# Patient Record
Sex: Female | Born: 1965 | Race: White | Hispanic: No | Marital: Married | Smoking: Former smoker
Health system: Southern US, Community
[De-identification: ages and names within clinical notes are randomized; demographics above are authoritative.]

## PROBLEM LIST (undated history)

## (undated) DIAGNOSIS — R112 Nausea with vomiting, unspecified: Secondary | ICD-10-CM

## (undated) DIAGNOSIS — R51 Headache: Secondary | ICD-10-CM

## (undated) DIAGNOSIS — R519 Headache, unspecified: Secondary | ICD-10-CM

## (undated) DIAGNOSIS — Z9889 Other specified postprocedural states: Secondary | ICD-10-CM

## (undated) HISTORY — PX: DILATION AND CURETTAGE OF UTERUS: SHX78

## (undated) HISTORY — PX: TUBAL LIGATION: SHX77

## (undated) HISTORY — PX: NASAL SEPTUM SURGERY: SHX37

---

## 2005-07-19 ENCOUNTER — Other Ambulatory Visit: Admission: RE | Admit: 2005-07-19 | Discharge: 2005-07-19 | Payer: Self-pay | Admitting: Gynecology

## 2008-10-23 ENCOUNTER — Ambulatory Visit: Payer: Self-pay | Admitting: Women's Health

## 2008-10-23 ENCOUNTER — Encounter: Payer: Self-pay | Admitting: Women's Health

## 2008-10-23 ENCOUNTER — Other Ambulatory Visit: Admission: RE | Admit: 2008-10-23 | Discharge: 2008-10-23 | Payer: Self-pay | Admitting: Gynecology

## 2009-08-21 ENCOUNTER — Other Ambulatory Visit: Admission: RE | Admit: 2009-08-21 | Discharge: 2009-08-21 | Payer: Self-pay | Admitting: Gynecology

## 2009-08-21 ENCOUNTER — Ambulatory Visit: Payer: Self-pay | Admitting: Women's Health

## 2009-08-21 ENCOUNTER — Encounter: Payer: Self-pay | Admitting: Women's Health

## 2011-01-20 ENCOUNTER — Emergency Department (HOSPITAL_COMMUNITY)
Admission: EM | Admit: 2011-01-20 | Discharge: 2011-01-20 | Disposition: A | Discharge status: Home / Self Care | Payer: No Typology Code available for payment source | Attending: Emergency Medicine | Admitting: Emergency Medicine

## 2011-01-20 ENCOUNTER — Emergency Department (HOSPITAL_COMMUNITY): Payer: No Typology Code available for payment source

## 2011-01-20 DIAGNOSIS — M79609 Pain in unspecified limb: Secondary | ICD-10-CM | POA: Insufficient documentation

## 2011-01-20 DIAGNOSIS — S139XXA Sprain of joints and ligaments of unspecified parts of neck, initial encounter: Secondary | ICD-10-CM | POA: Insufficient documentation

## 2011-01-20 DIAGNOSIS — R0789 Other chest pain: Secondary | ICD-10-CM | POA: Insufficient documentation

## 2011-01-20 DIAGNOSIS — S8010XA Contusion of unspecified lower leg, initial encounter: Secondary | ICD-10-CM | POA: Insufficient documentation

## 2011-01-20 DIAGNOSIS — M542 Cervicalgia: Secondary | ICD-10-CM | POA: Insufficient documentation

## 2011-06-02 ENCOUNTER — Other Ambulatory Visit: Payer: Self-pay | Admitting: Women's Health

## 2011-06-02 ENCOUNTER — Other Ambulatory Visit (HOSPITAL_COMMUNITY)
Admission: RE | Admit: 2011-06-02 | Discharge: 2011-06-02 | Disposition: A | Discharge status: Home / Self Care | Payer: BC Managed Care – PPO | Source: Ambulatory Visit | Attending: Gynecology | Admitting: Gynecology

## 2011-06-02 ENCOUNTER — Encounter (INDEPENDENT_AMBULATORY_CARE_PROVIDER_SITE_OTHER): Payer: BC Managed Care – PPO | Admitting: Women's Health

## 2011-06-02 DIAGNOSIS — Z833 Family history of diabetes mellitus: Secondary | ICD-10-CM

## 2011-06-02 DIAGNOSIS — Z124 Encounter for screening for malignant neoplasm of cervix: Secondary | ICD-10-CM | POA: Insufficient documentation

## 2011-06-02 DIAGNOSIS — Z01419 Encounter for gynecological examination (general) (routine) without abnormal findings: Secondary | ICD-10-CM

## 2011-06-02 DIAGNOSIS — N912 Amenorrhea, unspecified: Secondary | ICD-10-CM

## 2011-06-02 DIAGNOSIS — R82998 Other abnormal findings in urine: Secondary | ICD-10-CM

## 2011-07-14 ENCOUNTER — Ambulatory Visit: Payer: BC Managed Care – PPO | Admitting: Women's Health

## 2011-07-14 ENCOUNTER — Other Ambulatory Visit: Payer: BC Managed Care – PPO

## 2011-07-21 ENCOUNTER — Encounter: Payer: Self-pay | Admitting: Women's Health

## 2011-07-21 ENCOUNTER — Ambulatory Visit (INDEPENDENT_AMBULATORY_CARE_PROVIDER_SITE_OTHER): Payer: BC Managed Care – PPO | Admitting: Women's Health

## 2011-07-21 ENCOUNTER — Other Ambulatory Visit: Payer: BC Managed Care – PPO

## 2011-07-21 ENCOUNTER — Other Ambulatory Visit: Payer: Self-pay

## 2011-07-21 VITALS — BP 120/70

## 2011-07-21 DIAGNOSIS — N912 Amenorrhea, unspecified: Secondary | ICD-10-CM

## 2011-07-21 DIAGNOSIS — R141 Gas pain: Secondary | ICD-10-CM

## 2011-07-21 DIAGNOSIS — R143 Flatulence: Secondary | ICD-10-CM

## 2011-07-21 DIAGNOSIS — M545 Low back pain, unspecified: Secondary | ICD-10-CM

## 2011-07-21 DIAGNOSIS — N946 Dysmenorrhea, unspecified: Secondary | ICD-10-CM

## 2011-07-21 DIAGNOSIS — R142 Eructation: Secondary | ICD-10-CM

## 2011-07-21 DIAGNOSIS — N926 Irregular menstruation, unspecified: Secondary | ICD-10-CM

## 2011-07-21 MED ORDER — IBUPROFEN 600 MG PO TABS: 600.0000 mg | ORAL_TABLET | Freq: Three times a day (TID) | ORAL | Status: AC | PRN

## 2011-07-21 NOTE — Progress Notes (Signed)
  Presents for an ultrasound. Was seen in the office for an annual in June of 2012. She has had a BTL, and has been amenorrheic for 3 months without menopausal symptoms. Her FSH in June was 9. We were going to withdrawal her with Provera, but she started her cycle the next day. She had a normal cycle in July for 4-5 days, states was slightly heavier with clots and cramping. Did review if her cycles space greater than 60 days to call the office. Did review menopause, she is having no symptoms at this time. Ultrasound today does show a uterus with some cortical cystic areas uterus does appear arcuate or subseptate she does have a history  4 D&Cs. The right endometrium is 7.52mm, right endometrium is 10mm, the right ovary does have follicles with the thin-walled cyst 19 x 15 did review probably functional. left ovary was normal. Will watch at this time, and call or return if cycle problems.

## 2011-07-30 ENCOUNTER — Other Ambulatory Visit: Payer: Self-pay

## 2011-07-30 MED ORDER — FLUCONAZOLE 150 MG PO TABS: 150.0000 mg | ORAL_TABLET | Freq: Once | ORAL | Status: AC

## 2011-07-30 NOTE — Telephone Encounter (Signed)
Sherri, please call in Diflucan 150 by mouth x1 dose. Instructed to come to office if no relief of symptoms.

## 2011-07-30 NOTE — Telephone Encounter (Signed)
C-O EXTERNAL VAGINAL ITCHING & WHITE DISCHARGE. LEAVING TO GO OUT OF TOWN AGAIN THIS WEEKEND & IS REQUESTING DIFLUCAN RX.

## 2011-08-30 ENCOUNTER — Encounter: Payer: Self-pay | Admitting: Women's Health

## 2012-02-04 ENCOUNTER — Other Ambulatory Visit: Payer: Self-pay | Admitting: Women's Health

## 2012-07-05 ENCOUNTER — Ambulatory Visit (INDEPENDENT_AMBULATORY_CARE_PROVIDER_SITE_OTHER): Payer: BC Managed Care – PPO | Admitting: Women's Health

## 2012-07-05 ENCOUNTER — Encounter: Payer: Self-pay | Admitting: Women's Health

## 2012-07-05 VITALS — BP 120/70 | Ht 65.5 in | Wt 172.0 lb

## 2012-07-05 DIAGNOSIS — N390 Urinary tract infection, site not specified: Secondary | ICD-10-CM

## 2012-07-05 DIAGNOSIS — Z01419 Encounter for gynecological examination (general) (routine) without abnormal findings: Secondary | ICD-10-CM

## 2012-07-05 DIAGNOSIS — Z87440 Personal history of urinary (tract) infections: Secondary | ICD-10-CM

## 2012-07-05 DIAGNOSIS — E079 Disorder of thyroid, unspecified: Secondary | ICD-10-CM

## 2012-07-05 LAB — TSH: TSH: 3.703 u[IU]/mL (ref 0.350–4.500)

## 2012-07-05 MED ORDER — NITROFURANTOIN MACROCRYSTAL 50 MG PO CAPS: ORAL_CAPSULE | ORAL | Status: DC

## 2012-07-05 NOTE — Progress Notes (Signed)
Janice Mendez 1966-09-13 [id number]    History:    The patient presents for annual exam.  3-4 day cycle every 3-8 weeks/BTL. History of normal Paps. Has not had a mammogram. History of recurrent UTIs, uses Macrodantin 50 mg with intercourse, with good relief.  Past medical history, past surgical history, family history and social history were all reviewed and documented in the EPIC chart. Daughter Brittney 39. Patient's Father was murdered at age 44. Works at [employer].  ROS:  A  ROS was performed and pertinent positives and negatives are included in the history.  Exam:  Filed Vitals:   07/05/12 1128  BP: 120/70    General appearance:  Normal Head/Neck:  Normal, without cervical or supraclavicular adenopathy. Thyroid:  Symmetrical, normal in size, without palpable masses or nodularity. Respiratory  Effort:  Normal  Auscultation:  Clear without wheezing or rhonchi Cardiovascular  Auscultation:  Regular rate, without rubs, murmurs or gallops  Edema/varicosities:  Not grossly evident Abdominal  Soft,nontender, without masses, guarding or rebound.  Liver/spleen:  No organomegaly noted  Hernia:  None appreciated  Skin  Inspection:  Grossly normal  Palpation:  Grossly normal Neurologic/psychiatric  Orientation:  Normal with appropriate conversation.  Mood/affect:  Normal  Genitourinary    Breasts: Examined lying and sitting.     Right: Without masses, retractions, discharge or axillary adenopathy.     Left: Without masses, retractions, discharge or axillary adenopathy.   Inguinal/mons:  Normal without inguinal adenopathy  External genitalia:  Normal  BUS/Urethra/Skene's glands:  Normal  Bladder:  Normal  Vagina:  Normal  Cervix:  Normal  Uterus:   normal in size, shape and contour.  Midline and mobile  Adnexa/parametria:     Rt: Without masses or tenderness.   Lt: Without masses or tenderness.  Anus and perineum: Normal  Digital rectal exam: Normal sphincter tone without  palpated masses or tenderness  Assessment/Plan:  46 y.o. MWF G4P1 for annual exam with no complaints other than frustration with weight.  Normal GYN exam History of recurrent UTIs with good relief with Macrodantin with intercourse  Plan: Has not had a mammogram, reviewed importance of screening mammograms instructed to schedule, breast center number was given to schedule. SBE's, increase exercise and decrease calories for weight loss, calcium rich diet, vitamin D 1000 daily encouraged. Instructed to call if cycles space greater than 60 days. Macrodantin 50 mg by mouth with intercourse given with instructions. Had labs drawn at work which she states are normal instructed to faxed to our office. TSH only today. No Pap, new screening recommendations reviewed.    Harrington Challenger WHNP, 1:05 PM 07/05/2012

## 2012-07-05 NOTE — Patient Instructions (Signed)

## 2013-07-06 ENCOUNTER — Other Ambulatory Visit: Payer: Self-pay | Admitting: Women's Health

## 2013-07-06 DIAGNOSIS — N390 Urinary tract infection, site not specified: Secondary | ICD-10-CM

## 2013-07-06 MED ORDER — NITROFURANTOIN MACROCRYSTAL 50 MG PO CAPS: ORAL_CAPSULE | ORAL | Status: DC

## 2013-10-09 ENCOUNTER — Ambulatory Visit (INDEPENDENT_AMBULATORY_CARE_PROVIDER_SITE_OTHER): Payer: BC Managed Care – PPO | Admitting: Women's Health

## 2013-10-09 ENCOUNTER — Other Ambulatory Visit (HOSPITAL_COMMUNITY)
Admission: RE | Admit: 2013-10-09 | Discharge: 2013-10-09 | Disposition: A | Discharge status: Home / Self Care | Payer: BC Managed Care – PPO | Source: Ambulatory Visit | Attending: Gynecology | Admitting: Gynecology

## 2013-10-09 ENCOUNTER — Encounter: Payer: Self-pay | Admitting: Women's Health

## 2013-10-09 VITALS — BP 118/80 | Ht 67.0 in | Wt 172.2 lb

## 2013-10-09 DIAGNOSIS — Z01419 Encounter for gynecological examination (general) (routine) without abnormal findings: Secondary | ICD-10-CM | POA: Insufficient documentation

## 2013-10-09 DIAGNOSIS — E079 Disorder of thyroid, unspecified: Secondary | ICD-10-CM

## 2013-10-09 DIAGNOSIS — N926 Irregular menstruation, unspecified: Secondary | ICD-10-CM

## 2013-10-09 DIAGNOSIS — N390 Urinary tract infection, site not specified: Secondary | ICD-10-CM

## 2013-10-09 DIAGNOSIS — Z833 Family history of diabetes mellitus: Secondary | ICD-10-CM

## 2013-10-09 LAB — TSH: TSH: 3.033 u[IU]/mL (ref 0.350–4.500)

## 2013-10-09 MED ORDER — NITROFURANTOIN MACROCRYSTAL 50 MG PO CAPS: ORAL_CAPSULE | ORAL | Status: DC

## 2013-10-09 NOTE — Progress Notes (Signed)
Janice Mendez [birth year] [id number]    History:    The patient presents for annual exam.  Last year cycles every 4-8 weeks. Normal cycle in June and 2 weeks ago. BTL.  Has had difficulty losing weight. Normal Pap history. Has not had a mammogram. Labs at work/ reports as normal. History of recurrent UTIs, Macrodantin 50 mg with intercourse and has had no UTIs in years.   Past medical history, past surgical history, family history and social history were all reviewed and documented in the EPIC chart.  Travels with work at [employer]. Mother hypertension, questionable cervical cancer but did not have surgery. Father unknown medical history, murdered  when young. Daughter Philippa [place] 27 doing well.   ROS:  A  ROS was performed and pertinent positives and negatives are included in the history.  Exam:  Filed Vitals:   10/09/13 1101  BP: 118/80    General appearance:  Normal Head/Neck:  Normal, without cervical or supraclavicular adenopathy. Thyroid:  Symmetrical, normal in size, without palpable masses or nodularity. Respiratory  Effort:  Normal  Auscultation:  Clear without wheezing or rhonchi Cardiovascular  Auscultation:  Regular rate, without rubs, murmurs or gallops  Edema/varicosities:  Not grossly evident Abdominal  Soft,nontender, without masses, guarding or rebound.  Liver/spleen:  No organomegaly noted  Hernia:  None appreciated  Skin  Inspection:  Grossly normal  Palpation:  Grossly normal Neurologic/psychiatric  Orientation:  Normal with appropriate conversation.  Mood/affect:  Normal  Genitourinary    Breasts: Examined lying and sitting.     Right: Without masses, retractions, discharge or axillary adenopathy.     Left: Without masses, retractions, discharge or axillary adenopathy.   Inguinal/mons:  Normal without inguinal adenopathy  External genitalia:  Normal  BUS/Urethra/Skene's glands:  Normal  Bladder:  Normal  Vagina:  Normal  Cervix:  Normal  Uterus:    normal in size, shape and contour.  Midline and mobile  Adnexa/parametria:     Rt: Without masses or tenderness.   Lt: Without masses or tenderness.  Anus and perineum: Normal  Digital rectal exam: Normal sphincter tone without palpated masses or tenderness  Assessment/Plan:  47 y.o.MWF G1P1  for annual exam.   Irregular cycles Recurrent UTIs resolved with Macrodantin 50 with intercourse  Plan: TSH, prolactin, and FSH. Pap, Pap normal 2012, new screening guidelines reviewed. Instructed to call if cycles space greater than 3 months. SBE's, reviewed importance of annual screening mammogram, instructed to schedule. Reviewed importance of regular exercise, calcium rich diet, vitamin D 1000 daily. Continue regular exercise. Macrodantin 50 mg with intercourse, prescription, proper use given and reviewed.   Harrington Challenger [facility], 12:51 PM 10/09/2013

## 2013-10-09 NOTE — Patient Instructions (Signed)

## 2013-10-10 ENCOUNTER — Other Ambulatory Visit: Payer: Self-pay

## 2013-10-10 DIAGNOSIS — Z1231 Encounter for screening mammogram for malignant neoplasm of breast: Secondary | ICD-10-CM

## 2013-10-10 LAB — URINALYSIS W MICROSCOPIC + REFLEX CULTURE
Bacteria, UA: NONE SEEN
Bilirubin Urine: NEGATIVE
Casts: NONE SEEN
Crystals: NONE SEEN
Glucose, UA: NEGATIVE mg/dL
Hgb urine dipstick: NEGATIVE
Ketones, ur: NEGATIVE mg/dL
Leukocytes, UA: NEGATIVE
Nitrite: NEGATIVE
Protein, ur: NEGATIVE mg/dL
Specific Gravity, Urine: 1.016 (ref 1.005–1.030)
Urobilinogen, UA: 1 mg/dL (ref 0.0–1.0)
pH: 7 (ref 5.0–8.0)

## 2013-10-10 LAB — PROLACTIN: Prolactin: 14.1 ng/mL

## 2013-11-13 ENCOUNTER — Ambulatory Visit
Admission: RE | Admit: 2013-11-13 | Discharge: 2013-11-13 | Disposition: A | Discharge status: Home / Self Care | Payer: BC Managed Care – PPO | Source: Ambulatory Visit

## 2013-11-13 DIAGNOSIS — Z1231 Encounter for screening mammogram for malignant neoplasm of breast: Secondary | ICD-10-CM

## 2014-10-07 ENCOUNTER — Other Ambulatory Visit: Payer: Self-pay | Admitting: Dermatology

## 2014-10-14 ENCOUNTER — Encounter: Payer: Self-pay | Admitting: Women's Health

## 2015-12-14 DIAGNOSIS — T884XXA Failed or difficult intubation, initial encounter: Secondary | ICD-10-CM

## 2015-12-14 HISTORY — DX: Failed or difficult intubation, initial encounter: T88.4XXA

## 2016-02-11 ENCOUNTER — Other Ambulatory Visit: Payer: Self-pay

## 2016-02-11 DIAGNOSIS — Z1231 Encounter for screening mammogram for malignant neoplasm of breast: Secondary | ICD-10-CM

## 2016-02-19 ENCOUNTER — Ambulatory Visit (INDEPENDENT_AMBULATORY_CARE_PROVIDER_SITE_OTHER): Payer: BLUE CROSS/BLUE SHIELD | Admitting: Women's Health

## 2016-02-19 ENCOUNTER — Encounter: Payer: Self-pay | Admitting: Women's Health

## 2016-02-19 VITALS — BP 130/90 | Ht 65.25 in | Wt 177.0 lb

## 2016-02-19 DIAGNOSIS — Z13220 Encounter for screening for lipoid disorders: Secondary | ICD-10-CM | POA: Diagnosis not present

## 2016-02-19 DIAGNOSIS — Z01419 Encounter for gynecological examination (general) (routine) without abnormal findings: Secondary | ICD-10-CM

## 2016-02-19 DIAGNOSIS — Z1329 Encounter for screening for other suspected endocrine disorder: Secondary | ICD-10-CM | POA: Diagnosis not present

## 2016-02-19 DIAGNOSIS — N390 Urinary tract infection, site not specified: Secondary | ICD-10-CM | POA: Diagnosis not present

## 2016-02-19 LAB — LIPID PANEL
Cholesterol: 194 mg/dL (ref 125–200)
HDL: 47 mg/dL (ref >=46)
LDL Cholesterol: 108 mg/dL (ref <130)
Total CHOL/HDL Ratio: 4.1 Ratio (ref <=5.0)
Triglycerides: 193 mg/dL — ABNORMAL HIGH (ref <150)
VLDL: 39 mg/dL — ABNORMAL HIGH (ref <30)

## 2016-02-19 LAB — CBC WITH DIFFERENTIAL/PLATELET
Basophils Absolute: 0 10*3/uL (ref 0.0–0.1)
Basophils Relative: 0 % (ref 0–1)
Eosinophils Absolute: 0.3 10*3/uL (ref 0.0–0.7)
Eosinophils Relative: 5 % (ref 0–5)
HCT: 42.2 % (ref 36.0–46.0)
Hemoglobin: 14.3 g/dL (ref 12.0–15.0)
Lymphocytes Relative: 26 % (ref 12–46)
Lymphs Abs: 1.3 10*3/uL (ref 0.7–4.0)
MCH: 33.9 pg (ref 26.0–34.0)
MCHC: 33.9 g/dL (ref 30.0–36.0)
MCV: 100 fL (ref 78.0–100.0)
MPV: 11.2 fL (ref 8.6–12.4)
Monocytes Absolute: 0.5 10*3/uL (ref 0.1–1.0)
Monocytes Relative: 9 % (ref 3–12)
Neutro Abs: 3.1 10*3/uL (ref 1.7–7.7)
Neutrophils Relative %: 60 % (ref 43–77)
Platelets: 220 10*3/uL (ref 150–400)
RBC: 4.22 MIL/uL (ref 3.87–5.11)
RDW: 12.9 % (ref 11.5–15.5)
WBC: 5.1 10*3/uL (ref 4.0–10.5)

## 2016-02-19 LAB — TSH: TSH: 2.96 mIU/L

## 2016-02-19 LAB — COMPREHENSIVE METABOLIC PANEL
ALT: 17 U/L (ref 6–29)
AST: 18 U/L (ref 10–35)
Albumin: 3.7 g/dL (ref 3.6–5.1)
Alkaline Phosphatase: 56 U/L (ref 33–115)
BUN: 13 mg/dL (ref 7–25)
CO2: 28 mmol/L (ref 20–31)
Calcium: 8.8 mg/dL (ref 8.6–10.2)
Chloride: 103 mmol/L (ref 98–110)
Creat: 0.83 mg/dL (ref 0.50–1.10)
Glucose, Bld: 83 mg/dL (ref 65–99)
Potassium: 4.2 mmol/L (ref 3.5–5.3)
Sodium: 138 mmol/L (ref 135–146)
Total Bilirubin: 0.4 mg/dL (ref 0.2–1.2)
Total Protein: 6.6 g/dL (ref 6.1–8.1)

## 2016-02-19 MED ORDER — NITROFURANTOIN MACROCRYSTAL 50 MG PO CAPS: ORAL_CAPSULE | ORAL | Status: DC

## 2016-02-19 NOTE — Patient Instructions (Signed)
Health Maintenance, Female Adopting a healthy lifestyle and getting preventive care can go a long way to promote health and wellness. Talk with your health care provider about what schedule of regular examinations is right for you. This is a good chance for you to check in with your provider about disease prevention and staying healthy. In between checkups, there are plenty of things you can do on your own. Experts have done a lot of research about which lifestyle changes and preventive measures are most likely to keep you healthy. Ask your health care provider for more information. WEIGHT AND DIET  Eat a healthy diet  Be sure to include plenty of vegetables, fruits, low-fat dairy products, and lean protein.  Do not eat a lot of foods high in solid fats, added sugars, or salt.  Get regular exercise. This is one of the most important things you can do for your health.  Most adults should exercise for at least 150 minutes each week. The exercise should increase your heart rate and make you sweat (moderate-intensity exercise).  Most adults should also do strengthening exercises at least twice a week. This is in addition to the moderate-intensity exercise.  Maintain a healthy weight  Body mass index (BMI) is a measurement that can be used to identify possible weight problems. It estimates body fat based on height and weight. Your health care provider can help determine your BMI and help you achieve or maintain a healthy weight.  For females 20 years of age and older:   A BMI below 18.5 is considered underweight.  A BMI of 18.5 to 24.9 is normal.  A BMI of 25 to 29.9 is considered overweight.  A BMI of 30 and above is considered obese.  Watch levels of cholesterol and blood lipids  You should start having your blood tested for lipids and cholesterol at 48 years of age, then have this test every 5 years.  You may need to have your cholesterol levels checked more often if:  Your lipid  or cholesterol levels are high.  You are older than 48 years of age.  You are at high risk for heart disease.  CANCER SCREENING   Lung Cancer  Lung cancer screening is recommended for adults 55-80 years old who are at high risk for lung cancer because of a history of smoking.  A yearly low-dose CT scan of the lungs is recommended for people who:  Currently smoke.  Have quit within the past 15 years.  Have at least a 30-pack-year history of smoking. A pack year is smoking an average of one pack of cigarettes a day for 1 year.  Yearly screening should continue until it has been 15 years since you quit.  Yearly screening should stop if you develop a health problem that would prevent you from having lung cancer treatment.  Breast Cancer  Practice breast self-awareness. This means understanding how your breasts normally appear and feel.  It also means doing regular breast self-exams. Let your health care provider know about any changes, no matter how small.  If you are in your 20s or 30s, you should have a clinical breast exam (CBE) by a health care provider every 1-3 years as part of a regular health exam.  If you are 40 or older, have a CBE every year. Also consider having a breast X-ray (mammogram) every year.  If you have a family history of breast cancer, talk to your health care provider about genetic screening.  If you   are at high risk for breast cancer, talk to your health care provider about having an MRI and a mammogram every year.  Breast cancer gene (BRCA) assessment is recommended for women who have family members with BRCA-related cancers. BRCA-related cancers include:  Breast.  Ovarian.  Tubal.  Peritoneal cancers.  Results of the assessment will determine the need for genetic counseling and BRCA1 and BRCA2 testing. Cervical Cancer Your health care provider may recommend that you be screened regularly for cancer of the pelvic organs (ovaries, uterus, and  vagina). This screening involves a pelvic examination, including checking for microscopic changes to the surface of your cervix (Pap test). You may be encouraged to have this screening done every 3 years, beginning at age 21.  For women ages 30-65, health care providers may recommend pelvic exams and Pap testing every 3 years, or they may recommend the Pap and pelvic exam, combined with testing for human papilloma virus (HPV), every 5 years. Some types of HPV increase your risk of cervical cancer. Testing for HPV may also be done on women of any age with unclear Pap test results.  Other health care providers may not recommend any screening for nonpregnant women who are considered low risk for pelvic cancer and who do not have symptoms. Ask your health care provider if a screening pelvic exam is right for you.  If you have had past treatment for cervical cancer or a condition that could lead to cancer, you need Pap tests and screening for cancer for at least 20 years after your treatment. If Pap tests have been discontinued, your risk factors (such as having a new sexual partner) need to be reassessed to determine if screening should resume. Some women have medical problems that increase the chance of getting cervical cancer. In these cases, your health care provider may recommend more frequent screening and Pap tests. Colorectal Cancer  This type of cancer can be detected and often prevented.  Routine colorectal cancer screening usually begins at 48 years of age and continues through 48 years of age.  Your health care provider may recommend screening at an earlier age if you have risk factors for colon cancer.  Your health care provider may also recommend using home test kits to check for hidden blood in the stool.  A small camera at the end of a tube can be used to examine your colon directly (sigmoidoscopy or colonoscopy). This is done to check for the earliest forms of colorectal  cancer.  Routine screening usually begins at age 50.  Direct examination of the colon should be repeated every 5-10 years through 48 years of age. However, you may need to be screened more often if early forms of precancerous polyps or small growths are found. Skin Cancer  Check your skin from head to toe regularly.  Tell your health care provider about any new moles or changes in moles, especially if there is a change in a mole's shape or color.  Also tell your health care provider if you have a mole that is larger than the size of a pencil eraser.  Always use sunscreen. Apply sunscreen liberally and repeatedly throughout the day.  Protect yourself by wearing long sleeves, pants, a wide-brimmed hat, and sunglasses whenever you are outside. HEART DISEASE, DIABETES, AND HIGH BLOOD PRESSURE   High blood pressure causes heart disease and increases the risk of stroke. High blood pressure is more likely to develop in:  People who have blood pressure in the high end   of the normal range (130-139/85-89 mm Hg).  People who are overweight or obese.  People who are African American.  If you are 41-94 years of age, have your blood pressure checked every 3-5 years. If you are 96 years of age or older, have your blood pressure checked every year. You should have your blood pressure measured twice--once when you are at a hospital or clinic, and once when you are not at a hospital or clinic. Record the average of the two measurements. To check your blood pressure when you are not at a hospital or clinic, you can use:  An automated blood pressure machine at a pharmacy.  A home blood pressure monitor.  If you are between 18 years and 86 years old, ask your health care provider if you should take aspirin to prevent strokes.  Have regular diabetes screenings. This involves taking a blood sample to check your fasting blood sugar level.  If you are at a normal weight and have a low risk for diabetes,  have this test once every three years after 48 years of age.  If you are overweight and have a high risk for diabetes, consider being tested at a younger age or more often. PREVENTING INFECTION  Hepatitis B  If you have a higher risk for hepatitis B, you should be screened for this virus. You are considered at high risk for hepatitis B if:  You were born in a country where hepatitis B is common. Ask your health care provider which countries are considered high risk.  Your parents were born in a high-risk country, and you have not been immunized against hepatitis B (hepatitis B vaccine).  You have HIV or AIDS.  You use needles to inject street drugs.  You live with someone who has hepatitis B.  You have had sex with someone who has hepatitis B.  You get hemodialysis treatment.  You take certain medicines for conditions, including cancer, organ transplantation, and autoimmune conditions. Hepatitis C  Blood testing is recommended for:  Everyone born from [birth year] through 1963.  Anyone with known risk factors for hepatitis C. Sexually transmitted infections (STIs)  You should be screened for sexually transmitted infections (STIs) including gonorrhea and chlamydia if:  You are sexually active and are younger than 48 years of age.  You are older than 48 years of age and your health care provider tells you that you are at risk for this type of infection.  Your sexual activity has changed since you were last screened and you are at an increased risk for chlamydia or gonorrhea. Ask your health care provider if you are at risk.  If you do not have HIV, but are at risk, it may be recommended that you take a prescription medicine daily to prevent HIV infection. This is called pre-exposure prophylaxis (PrEP). You are considered at risk if:  You are sexually active and do not regularly use condoms or know the HIV status of your partner(s).  You take drugs by injection.  You are sexually  active with a partner who has HIV. Talk with your health care provider about whether you are at high risk of being infected with HIV. If you choose to begin PrEP, you should first be tested for HIV. You should then be tested every 3 months for as long as you are taking PrEP.  PREGNANCY   If you are premenopausal and you may become pregnant, ask your health care provider about preconception counseling.  If you may  become pregnant, take 400 to 800 micrograms (mcg) of folic acid every day.  If you want to prevent pregnancy, talk to your health care provider about birth control (contraception). OSTEOPOROSIS AND MENOPAUSE   Osteoporosis is a disease in which the bones lose minerals and strength with aging. This can result in serious bone fractures. Your risk for osteoporosis can be identified using a bone density scan.  If you are 30 years of age or older, or if you are at risk for osteoporosis and fractures, ask your health care provider if you should be screened.  Ask your health care provider whether you should take a calcium or vitamin D supplement to lower your risk for osteoporosis.  Menopause may have certain physical symptoms and risks.  Hormone replacement therapy may reduce some of these symptoms and risks. Talk to your health care provider about whether hormone replacement therapy is right for you.  HOME CARE INSTRUCTIONS   Schedule regular health, dental, and eye exams.  Stay current with your immunizations.   Do not use any tobacco products including cigarettes, chewing tobacco, or electronic cigarettes.  If you are pregnant, do not drink alcohol.  If you are breastfeeding, limit how much and how often you drink alcohol.  Limit alcohol intake to no more than 1 drink per day for nonpregnant women. One drink equals 12 ounces of beer, 5 ounces of wine, or 1 ounces of hard liquor.  Do not use street drugs.  Do not share needles.  Ask your health care provider for help if  you need support or information about quitting drugs.  Tell your health care provider if you often feel depressed.  Tell your health care provider if you have ever been abused or do not feel safe at home.   This information is not intended to replace advice given to you by your health care provider. Make sure you discuss any questions you have with your health care provider.   Document Released: 06/14/2011 Document Revised: 12/20/2014 Document Reviewed: 10/31/2013 Elsevier Interactive Patient Education [employer].

## 2016-02-19 NOTE — Progress Notes (Signed)
Janice Mendez 1966/06/23 [id number]    History:    Presents for annual exam.  Cycles every 1-3 months/BTL. Reports extremely stressful year last year husband has a blood clotting disorder hospitalized twice in ICU. History of recurrent UTIs uses Macrodantin 50mg  with intercourse. No UTIs in years. Normal Pap and mammogram history.  Past medical history, past surgical history, family history and social history were all reviewed and documented in the EPIC chart. Works for [employer] traveling to [place] and [place] often. Mother hypertension. Father was murdered when she was Benzion Mesta minimal history. [place] 32 doing well.  ROS:  A ROS was performed and pertinent positives and negatives are included.  Exam:  Filed Vitals:   02/19/16 1117  BP: 130/90    General appearance:  Normal Thyroid:  Symmetrical, normal in size, without palpable masses or nodularity. Respiratory  Auscultation:  Clear without wheezing or rhonchi Cardiovascular  Auscultation:  Regular rate, without rubs, murmurs or gallops  Edema/varicosities:  Not grossly evident Abdominal  Soft,nontender, without masses, guarding or rebound.  Liver/spleen:  No organomegaly noted  Hernia:  None appreciated  Skin  Inspection:  Grossly normal   Breasts: Examined lying and sitting.     Right: Without masses, retractions, discharge or axillary adenopathy.     Left: Without masses, retractions, discharge or axillary adenopathy. Gentitourinary   Inguinal/mons:  Normal without inguinal adenopathy  External genitalia:  Normal  BUS/Urethra/Skene's glands:  Normal  Vagina:  Normal  Cervix:  Normal  Uterus:   normal in size, shape and contour.  Midline and mobile  Adnexa/parametria:     Rt: Without masses or tenderness.   Lt: Without masses or tenderness.  Anus and perineum: Normal  Digital rectal exam: Normal sphincter tone without palpated masses or tenderness  Assessment/Plan:  50 y.o. MWF G4 P1 for annual exam with no  complaints.  Cycles every 1-3 months/BTL/no menopausal symptoms History of recurrent UTIs with good relief Macrodantin with intercourse Borderline blood pressure  Plan: Check blood pressure away from office if continues greater than 130/80 follow-up with primary care. SBE's, annual screening mammogram has scheduled reviewed importance of annual screen. Macrodantin 50 mg when necessary with Cordis prescription, proper use given and reviewed. Exercise, calcium rich diet, vitamin D 2000 daily encouraged. Menopause removed and struck to to call if cycles space greater than 3 months.  CBC, CMP, lipid panel, vitamin D, UA, Pap with HR HPV typing, new screening guidelines reviewed.    Huel Cote WHNP, 12:09 PM 02/19/2016

## 2016-02-23 LAB — PAP IG AND HPV HIGH-RISK: HPV DNA High Risk: NOT DETECTED

## 2016-02-24 ENCOUNTER — Ambulatory Visit
Admission: RE | Admit: 2016-02-24 | Discharge: 2016-02-24 | Disposition: A | Discharge status: Home / Self Care | Payer: BLUE CROSS/BLUE SHIELD | Source: Ambulatory Visit

## 2016-02-24 DIAGNOSIS — Z1231 Encounter for screening mammogram for malignant neoplasm of breast: Secondary | ICD-10-CM

## 2016-04-02 ENCOUNTER — Encounter: Payer: Self-pay | Admitting: Family Medicine

## 2016-04-02 ENCOUNTER — Ambulatory Visit (INDEPENDENT_AMBULATORY_CARE_PROVIDER_SITE_OTHER): Payer: BLUE CROSS/BLUE SHIELD | Admitting: Family Medicine

## 2016-04-02 VITALS — BP 118/78 | HR 78 | Temp 98.9°F | Resp 16 | Ht 65.0 in | Wt 177.1 lb

## 2016-04-02 DIAGNOSIS — E049 Nontoxic goiter, unspecified: Secondary | ICD-10-CM | POA: Diagnosis not present

## 2016-04-02 DIAGNOSIS — J302 Other seasonal allergic rhinitis: Secondary | ICD-10-CM

## 2016-04-02 DIAGNOSIS — E010 Iodine-deficiency related diffuse (endemic) goiter: Secondary | ICD-10-CM | POA: Insufficient documentation

## 2016-04-02 DIAGNOSIS — J0100 Acute maxillary sinusitis, unspecified: Secondary | ICD-10-CM

## 2016-04-02 DIAGNOSIS — J320 Chronic maxillary sinusitis: Secondary | ICD-10-CM | POA: Insufficient documentation

## 2016-04-02 MED ORDER — SULFAMETHOXAZOLE-TRIMETHOPRIM 800-160 MG PO TABS: 1.0000 | ORAL_TABLET | Freq: Two times a day (BID) | ORAL | Status: DC

## 2016-04-02 NOTE — Progress Notes (Signed)
   Subjective:    Patient ID: Janice Mendez, female    DOB: Sep 03, 1966, 50 y.o.   MRN: [id number]  HPI New to establish.  Previous MD- no PCP.  [place], [place]  URI- pt reports starting late Feb she developed palpitations and fatigue and was told that she had the flu, 'but it didn't feel like the flu'.  A few weeks ago developed severe congestion, tooth pain, sinus pressure, HA and was started on 5 days of Doxy by [facility].  She completed abx on Sunday but is not feeling better.  Continues to have HA, nasal congestion, PND, sinus pressure.  Now having increased GERD, has feeling that tongue is burning.  R ear pressure.  No fevers but + chills.  Taking Zyrtec daily 'for years' and using Nasacort regularly.   Review of Systems For ROS see HPI     Objective:   Physical Exam  Constitutional: She is oriented to person, place, and time. She appears well-developed and well-nourished. No distress.  HENT:  Head: Normocephalic and atraumatic.  Right Ear: Tympanic membrane normal.  Left Ear: Tympanic membrane normal.  Nose: Mucosal edema and rhinorrhea present. Right sinus exhibits maxillary sinus tenderness and frontal sinus tenderness. Left sinus exhibits maxillary sinus tenderness and frontal sinus tenderness.  Mouth/Throat: Uvula is midline and mucous membranes are normal. Posterior oropharyngeal erythema present. No oropharyngeal exudate.  Eyes: Conjunctivae and EOM are normal. Pupils are equal, round, and reactive to light.  Neck: Normal range of motion. Neck supple. Thyromegaly present.  Cardiovascular: Normal rate, regular rhythm and normal heart sounds.   Pulmonary/Chest: Effort normal and breath sounds normal. No respiratory distress. She has no wheezes.  Lymphadenopathy:    She has no cervical adenopathy.  Neurological: She is alert and oriented to person, place, and time.  Skin: Skin is warm and dry.  Psychiatric: She has a normal mood and affect. Her behavior is  normal. Thought content normal.  Vitals reviewed.         Assessment & Plan:

## 2016-04-02 NOTE — Progress Notes (Signed)
Pre visit review using our clinic review tool, if applicable. No additional management support is needed unless otherwise documented below in the visit note. 

## 2016-04-02 NOTE — Assessment & Plan Note (Signed)
New to provider, ongoing for pt.  She reports she was previously on thyroid medication.  Recent TSH at GYN was WNL.  Get [place] to assess.  Pt expressed understanding and is in agreement w/ plan.

## 2016-04-02 NOTE — Assessment & Plan Note (Signed)
New to provider, ongoing for pt x1 month.  Only had a 5 day course of Doxy but had no improvement.  Will switch to 14 day course of Bactrim as pt has hx of sinus infxns and says 'this is the worst I've had'.  Switch daily antihistamine to Claritin as pt has been on Zyrtec for years and this is likely no longer effective in her system.  Continue nasal steroid daily.  Reviewed supportive care and red flags that should prompt return.  Pt expressed understanding and is in agreement w/ plan.

## 2016-04-02 NOTE — Patient Instructions (Signed)
Schedule your complete physical in 6 months We'll notify you of your ultrasound appt for the thyroid Start the Bactrim twice daily- take w/ food Switch the Zyrtec to the Loratadine for the next month or so Continue the Nasacort daily Drink plenty of fluids REST! Tylenol/Ibuprofen for pain/fever Call with any questions or concerns Welcome!  We're glad to have you!!!

## 2016-04-02 NOTE — Assessment & Plan Note (Signed)
New to provider, ongoing for pt.  Will switch her current Zyrtec to Claritin as she is no longer having relief.  Continue nasal steroid daily.  Pt expressed understanding and is in agreement w/ plan.

## 2016-04-07 ENCOUNTER — Ambulatory Visit (HOSPITAL_COMMUNITY)
Admission: RE | Admit: 2016-04-07 | Discharge: 2016-04-07 | Disposition: A | Discharge status: Home / Self Care | Payer: BLUE CROSS/BLUE SHIELD | Source: Ambulatory Visit | Attending: Family Medicine | Admitting: Family Medicine

## 2016-04-07 ENCOUNTER — Other Ambulatory Visit: Payer: Self-pay | Admitting: Family Medicine

## 2016-04-07 ENCOUNTER — Telehealth: Payer: Self-pay | Admitting: Family Medicine

## 2016-04-07 DIAGNOSIS — E049 Nontoxic goiter, unspecified: Secondary | ICD-10-CM | POA: Insufficient documentation

## 2016-04-07 DIAGNOSIS — E010 Iodine-deficiency related diffuse (endemic) goiter: Secondary | ICD-10-CM | POA: Diagnosis not present

## 2016-04-07 DIAGNOSIS — E042 Nontoxic multinodular goiter: Secondary | ICD-10-CM | POA: Diagnosis not present

## 2016-04-13 ENCOUNTER — Other Ambulatory Visit: Payer: Self-pay | Admitting: Otolaryngology

## 2016-04-13 DIAGNOSIS — E042 Nontoxic multinodular goiter: Secondary | ICD-10-CM

## 2016-04-21 ENCOUNTER — Other Ambulatory Visit (HOSPITAL_COMMUNITY)
Admission: RE | Admit: 2016-04-21 | Discharge: 2016-04-21 | Disposition: A | Discharge status: Home / Self Care | Payer: BLUE CROSS/BLUE SHIELD | Source: Ambulatory Visit | Attending: Interventional Radiology | Admitting: Interventional Radiology

## 2016-04-21 ENCOUNTER — Ambulatory Visit
Admission: RE | Admit: 2016-04-21 | Discharge: 2016-04-21 | Disposition: A | Discharge status: Home / Self Care | Payer: BLUE CROSS/BLUE SHIELD | Source: Ambulatory Visit | Attending: Otolaryngology | Admitting: Otolaryngology

## 2016-04-21 DIAGNOSIS — E042 Nontoxic multinodular goiter: Secondary | ICD-10-CM | POA: Diagnosis not present

## 2016-04-30 ENCOUNTER — Encounter (HOSPITAL_COMMUNITY): Payer: Self-pay

## 2016-05-03 ENCOUNTER — Telehealth: Payer: Self-pay | Admitting: Family Medicine

## 2016-05-03 DIAGNOSIS — E049 Nontoxic goiter, unspecified: Secondary | ICD-10-CM

## 2016-05-03 NOTE — Telephone Encounter (Signed)
[place] for referral to Dr Chalmers Cater but Dr Chalmers Cater is an Endo, not a Psychologist, sport and exercise.  As long as she is aware of this

## 2016-05-03 NOTE — Telephone Encounter (Signed)
[place] for referral you seen pt on 4/17 and pt was diagnosed for thyromegaly

## 2016-05-03 NOTE — Telephone Encounter (Signed)
Pt states that she would like to have a referral to dr balan endocrinologist, pt states that she would like to have a 2nd option on her thyroid.

## 2016-05-03 NOTE — Telephone Encounter (Signed)
the patient informed and referral placed.

## 2016-05-18 ENCOUNTER — Ambulatory Visit: Payer: Self-pay | Admitting: Surgery

## 2016-05-19 NOTE — Patient Instructions (Addendum)
Janice Mendez  05/19/2016   Your procedure is scheduled on: Thursday 06/03/2016  Report to [facility] Main  Entrance take [place]  elevators to 3rd floor to  [place] at  0730 AM.  Call this number if you have problems the morning of surgery [phone]   Remember: ONLY 1 PERSON MAY GO WITH YOU TO SHORT STAY TO GET  READY MORNING OF [place].   Do not eat food or drink liquids :After Midnight.     Take these medicines the morning of surgery with A SIP OF WATER: Loratadine, Nasacort nasal spray if needed                                 You may not have any metal on your body including hair pins and              piercings  Do not wear jewelry, make-up, lotions, powders or perfumes, deodorant             Do not wear nail polish.  Do not shave  48 hours prior to surgery.           Do not bring valuables to the hospital. [place].  Contacts, dentures or bridgework may not be worn into surgery.  Leave suitcase in the car. After surgery it may be brought to your room.                  Please read over the following fact sheets you were given: _____________________________________________________________________             [facility] - Preparing for Surgery Before surgery, you can play an important role.  Because skin is not sterile, your skin needs to be as free of germs as possible.  You can reduce the number of germs on your skin by washing with CHG (chlorahexidine gluconate) soap before surgery.  CHG is an antiseptic cleaner which kills germs and bonds with the skin to continue killing germs even after washing. Please DO NOT use if you have an allergy to CHG or antibacterial soaps.  If your skin becomes reddened/irritated stop using the CHG and inform your nurse when you arrive at Short Stay. Do not shave (including legs and underarms) for at least 48 hours prior to the first CHG shower.  You  may shave your face/neck. Please follow these instructions carefully:  1.  Shower with CHG Soap the night before surgery and the  morning of Surgery.  2.  If you choose to wash your hair, wash your hair first as usual with your  normal  shampoo.  3.  After you shampoo, rinse your hair and body thoroughly to remove the  shampoo.                           4.  Use CHG as you would any other liquid soap.  You can apply chg directly  to the skin and wash                       Gently with a scrungie or clean washcloth.  5.  Apply the CHG Soap to your  body ONLY FROM THE NECK DOWN.   Do not use on face/ open                           Wound or open sores. Avoid contact with eyes, ears mouth and genitals (private parts).                       Wash face,  Genitals (private parts) with your normal soap.             6.  Wash thoroughly, paying special attention to the area where your surgery  will be performed.  7.  Thoroughly rinse your body with warm water from the neck down.  8.  DO NOT shower/wash with your normal soap after using and rinsing off  the CHG Soap.                9.  Pat yourself dry with a clean towel.            10.  Wear clean pajamas.            11.  Place clean sheets on your bed the night of your first shower and do not  sleep with pets. Day of Surgery : Do not apply any lotions/deodorants the morning of surgery.  Please wear clean clothes to the hospital/surgery center.  FAILURE TO FOLLOW THESE INSTRUCTIONS MAY RESULT IN THE CANCELLATION OF YOUR SURGERY PATIENT SIGNATURE_________________________________  NURSE SIGNATURE__________________________________  ________________________________________________________________________   Adam Phenix  An incentive spirometer is a tool that can help keep your lungs clear and active. This tool measures how well you are filling your lungs with each breath. Taking long deep breaths may help reverse or decrease the chance of  developing breathing (pulmonary) problems (especially infection) following:  A long period of time when you are unable to move or be active. BEFORE THE PROCEDURE   If the spirometer includes an indicator to show your best effort, your nurse or respiratory therapist will set it to a desired goal.  If possible, sit up straight or lean slightly forward. Try not to slouch.  Hold the incentive spirometer in an upright position. INSTRUCTIONS FOR USE  1. Sit on the edge of your bed if possible, or sit up as far as you can in bed or on a chair. 2. Hold the incentive spirometer in an upright position. 3. Breathe out normally. 4. Place the mouthpiece in your mouth and seal your lips tightly around it. 5. Breathe in slowly and as deeply as possible, raising the piston or the ball toward the top of the column. 6. Hold your breath for 3-5 seconds or for as long as possible. Allow the piston or ball to fall to the bottom of the column. 7. Remove the mouthpiece from your mouth and breathe out normally. 8. Rest for a few seconds and repeat Steps 1 through 7 at least 10 times every 1-2 hours when you are awake. Take your time and take a few normal breaths between deep breaths. 9. The spirometer may include an indicator to show your best effort. Use the indicator as a goal to work toward during each repetition. 10. After each set of 10 deep breaths, practice coughing to be sure your lungs are clear. If you have an incision (the cut made at the time of surgery), support your incision when coughing by placing a pillow or rolled up towels firmly against it.  Once you are able to get out of bed, walk around indoors and cough well. You may stop using the incentive spirometer when instructed by your caregiver.  RISKS AND COMPLICATIONS  Take your time so you do not get dizzy or light-headed.  If you are in pain, you may need to take or ask for pain medication before doing incentive spirometry. It is harder to take a  deep breath if you are having pain. AFTER USE  Rest and breathe slowly and easily.  It can be helpful to keep track of a log of your progress. Your caregiver can provide you with a simple table to help with this. If you are using the spirometer at home, follow these instructions: [place] IF:   You are having difficultly using the spirometer.  You have trouble using the spirometer as often as instructed.  Your pain medication is not giving enough relief while using the spirometer.  You develop fever of 100.5 F (38.1 C) or higher. SEEK IMMEDIATE MEDICAL CARE IF:   You cough up bloody sputum that had not been present before.  You develop fever of 102 F (38.9 C) or greater.  You develop worsening pain at or near the incision site. MAKE SURE YOU:   Understand these instructions.  Will watch your condition.  Will get help right away if you are not doing well or get worse. Document Released: 04/11/2007 Document Revised: 02/21/2012 Document Reviewed: 06/12/2007 [facility] Patient Information 2014 [place], [place].   ________________________________________________________________________

## 2016-05-20 ENCOUNTER — Encounter (HOSPITAL_COMMUNITY)
Admission: RE | Admit: 2016-05-20 | Discharge: 2016-05-20 | Disposition: A | Discharge status: Home / Self Care | Payer: BLUE CROSS/BLUE SHIELD | Source: Ambulatory Visit | Attending: Surgery | Admitting: Surgery

## 2016-05-20 ENCOUNTER — Encounter (INDEPENDENT_AMBULATORY_CARE_PROVIDER_SITE_OTHER): Payer: Self-pay

## 2016-05-20 ENCOUNTER — Encounter (HOSPITAL_COMMUNITY): Payer: Self-pay

## 2016-05-20 ENCOUNTER — Ambulatory Visit (HOSPITAL_COMMUNITY)
Admission: RE | Admit: 2016-05-20 | Discharge: 2016-05-20 | Disposition: A | Discharge status: Home / Self Care | Payer: BLUE CROSS/BLUE SHIELD | Source: Ambulatory Visit | Attending: Anesthesiology | Admitting: Anesthesiology

## 2016-05-20 DIAGNOSIS — Z01818 Encounter for other preprocedural examination: Secondary | ICD-10-CM

## 2016-05-20 HISTORY — DX: Headache: R51

## 2016-05-20 HISTORY — DX: Nausea with vomiting, unspecified: R11.2

## 2016-05-20 HISTORY — DX: Other specified postprocedural states: Z98.890

## 2016-05-20 HISTORY — DX: Headache, unspecified: R51.9

## 2016-05-20 LAB — CBC
HCT: 41.2 % (ref 36.0–46.0)
Hemoglobin: 14.3 g/dL (ref 12.0–15.0)
MCH: 34.5 pg — ABNORMAL HIGH (ref 26.0–34.0)
MCHC: 34.7 g/dL (ref 30.0–36.0)
MCV: 99.5 fL (ref 78.0–100.0)
Platelets: 231 10*3/uL (ref 150–400)
RBC: 4.14 MIL/uL (ref 3.87–5.11)
RDW: 12.4 % (ref 11.5–15.5)
WBC: 7.2 10*3/uL (ref 4.0–10.5)

## 2016-05-20 LAB — HCG, SERUM, QUALITATIVE: Preg, Serum: NEGATIVE

## 2016-06-02 ENCOUNTER — Encounter (HOSPITAL_COMMUNITY): Payer: Self-pay | Admitting: Surgery

## 2016-06-02 DIAGNOSIS — E042 Nontoxic multinodular goiter: Secondary | ICD-10-CM | POA: Diagnosis present

## 2016-06-02 DIAGNOSIS — D440 Neoplasm of uncertain behavior of thyroid gland: Secondary | ICD-10-CM | POA: Diagnosis present

## 2016-06-02 NOTE — H&P (Signed)
General Surgery [facility]  Janice Mendez DOB: 1966/04/27 Married / Language: Cleophus Molt / Race: White Female  History of Present Illness  The patient is a 50 year old female who presents with a thyroid nodule.  Patient is referred by Dr. Jacelyn Pi for evaluation of multiple thyroid nodules, thyroid goiter, and thyroid neoplasm of undetermined behavior. Patient's primary care physician is Dr. Annye Asa. Patient initially presented to her primary care provider with complaints of fatigue, low energy. Evaluation included physical examination and laboratory studies. Patient underwent a thyroid ultrasound in April 2017 showing an enlarged thyroid gland with the right lobe measuring 6.2 cm and left lobe measuring 7.1 cm. Bilateral thyroid nodules were noted. Dominant nodule was in the left inferior pole measuring 2.3 cm and containing coarse calcifications. Fine-needle aspiration biopsies were performed and the dominant nodule demonstrated cytologic atypia, Bethesda category IV. Subsequent molecular genetic testing by [facility] revealed a suspicious lesion with a risk of malignancy of approximately 40%. Patient is now referred for consideration for thyroidectomy for definitive diagnosis and management. Patient has no significant compressive symptoms. She has recently started taking thyroid hormone under the direction of her primary care physician. She is currently taking 50 g of levothyroxine daily. There is no family history of endocrine neoplasms. Patient has had no prior surgery on the head or neck. She works in [employer] and travels frequently. She is accompanied today by her husband.   Other Problems Back Pain Gastric Ulcer Thyroid Disease  Diagnostic Studies History Colonoscopy never Mammogram within last year Pap Smear 1-5 years ago  Allergies Penicillin VK *PENICILLINS* Hives.  Medication History Levothyroxine Sodium (50MCG Tablet,  Oral) Active. Medications Reconciled  Social History Alcohol use Moderate alcohol use. Caffeine use Tea. No drug use Tobacco use Current every day smoker.  Family History Cervical Cancer Mother. Hypertension Mother.  Pregnancy / Birth History Age at menarche 33 years. Contraceptive History Contraceptive implant, Oral contraceptives. Gravida 4 Irregular periods Maternal age 12-20 Para 1  Review of Systems General Present- Fatigue and Night Sweats. Not Present- Appetite Loss, Chills, Fever, Weight Gain and Weight Loss. Skin Not Present- Change in Wart/Mole, Dryness, Hives, Jaundice, New Lesions, Non-Healing Wounds, Rash and Ulcer. HEENT Present- Earache, Seasonal Allergies, Sinus Pain and Sore Throat. Not Present- Hearing Loss, Hoarseness, Nose Bleed, Oral Ulcers, Ringing in the Ears, Visual Disturbances, Wears glasses/contact lenses and Yellow Eyes. Respiratory Not Present- Bloody sputum, Chronic Cough, Difficulty Breathing, Snoring and Wheezing. Breast Not Present- Breast Mass, Breast Pain, Nipple Discharge and Skin Changes. Cardiovascular Present- Palpitations, Rapid Heart Rate and Shortness of Breath. Not Present- Chest Pain, Difficulty Breathing Lying Down, Leg Cramps and Swelling of Extremities. Gastrointestinal Present- Abdominal Pain. Not Present- Bloating, Bloody Stool, Change in Bowel Habits, Chronic diarrhea, Constipation, Difficulty Swallowing, Excessive gas, Gets full quickly at meals, Hemorrhoids, Indigestion, Nausea, Rectal Pain and Vomiting. Female Genitourinary Not Present- Frequency, Nocturia, Painful Urination, Pelvic Pain and Urgency. Musculoskeletal Not Present- Back Pain, Joint Pain, Joint Stiffness, Muscle Pain, Muscle Weakness and Swelling of Extremities. Neurological Present- Headaches. Not Present- Decreased Memory, Fainting, Numbness, Seizures, Tingling, Tremor, Trouble walking and Weakness. Psychiatric Not Present- Anxiety, Bipolar, Change in  Sleep Pattern, Depression, Fearful and Frequent crying. Endocrine Present- Cold Intolerance and Hair Changes. Not Present- Excessive Hunger, Heat Intolerance, Hot flashes and New Diabetes. Hematology Present- Easy Bruising. Not Present- Excessive bleeding, Gland problems, HIV and Persistent Infections.  Vitals  Weight: 178 lb Height: 67in Body Surface Area: 1.92 m Body Mass Index: 27.88  kg/m  Temp.: 97.28F(Temporal)  Pulse: 72 (Regular)  BP: 140/92 (Sitting, Left Arm, Standard)  Physical Exam  General - appears comfortable, no distress; not diaphorectic  HEENT - normocephalic; sclerae clear, gaze conjugate; mucous membranes moist, dentition good; voice normal  Neck - symmetric on extension; no palpable anterior or posterior cervical adenopathy; thyroid gland is mildly to moderately enlarged and multinodular; it is firm to palpation without discrete or dominant mass; there is no tenderness  Chest - clear bilaterally without rhonchi, rales, or wheeze  Cor - regular rhythm with normal rate; no significant murmur  Ext - non-tender without significant edema or lymphedema  Neuro - grossly intact; no tremor   Assessment & Plan  NEOPLASM OF UNCERTAIN BEHAVIOR OF THYROID GLAND (D44.0) MULTIPLE THYROID NODULES (E04.2)  Pt Education - Pamphlet Given - The Thyroid Book: discussed with patient and provided information.  Patient is referred with multiple thyroid nodules and a dominant nodule in the left lobe with cytologic atypia and a significant risk of malignancy by molecular genetic testing. Patient is provided with written literature on thyroid disease to review at home.  I reviewed the ultrasound and cytopathology reports with the patient and her husband in detail. I have recommended proceeding with total thyroidectomy for definitive diagnosis and management. Patient does have a risk of malignancy of approximately 40%. We discussed the risk and benefits of total  thyroidectomy including the risk of recurrent laryngeal nerve injury and injury to parathyroid glands. We discussed the hospital stay to be anticipated. We discussed the location of the surgical incision. We discussed the postoperative recovery. We discussed the need for lifelong thyroid hormone replacement. We discussed the potential need for radioactive iodine treatment. Patient understands and wishes to proceed with surgery in the near future.  The risks and benefits of the procedure have been discussed at length with the patient. The patient understands the proposed procedure, potential alternative treatments, and the course of recovery to be expected. All of the patient's questions have been answered at this time. The patient wishes to proceed with surgery.  Earnstine Regal, MD, [facility] Office: [phone]

## 2016-06-03 ENCOUNTER — Encounter (HOSPITAL_COMMUNITY): Payer: Self-pay | Admitting: *Deleted

## 2016-06-03 ENCOUNTER — Ambulatory Visit (HOSPITAL_COMMUNITY): Payer: BLUE CROSS/BLUE SHIELD | Admitting: Anesthesiology

## 2016-06-03 ENCOUNTER — Encounter (HOSPITAL_COMMUNITY)
Admission: RE | Disposition: A | Discharge status: Home / Self Care | Payer: Self-pay | Source: Ambulatory Visit | Attending: Surgery

## 2016-06-03 ENCOUNTER — Observation Stay (HOSPITAL_COMMUNITY)
Admission: RE | Admit: 2016-06-03 | Discharge: 2016-06-04 | Disposition: A | Discharge status: Home / Self Care | Payer: BLUE CROSS/BLUE SHIELD | Source: Ambulatory Visit | Attending: Surgery | Admitting: Surgery

## 2016-06-03 DIAGNOSIS — D440 Neoplasm of uncertain behavior of thyroid gland: Secondary | ICD-10-CM | POA: Diagnosis present

## 2016-06-03 DIAGNOSIS — R112 Nausea with vomiting, unspecified: Secondary | ICD-10-CM | POA: Insufficient documentation

## 2016-06-03 DIAGNOSIS — E063 Autoimmune thyroiditis: Secondary | ICD-10-CM | POA: Insufficient documentation

## 2016-06-03 DIAGNOSIS — Z79899 Other long term (current) drug therapy: Secondary | ICD-10-CM | POA: Diagnosis not present

## 2016-06-03 DIAGNOSIS — F17200 Nicotine dependence, unspecified, uncomplicated: Secondary | ICD-10-CM | POA: Diagnosis not present

## 2016-06-03 DIAGNOSIS — C73 Malignant neoplasm of thyroid gland: Principal | ICD-10-CM | POA: Insufficient documentation

## 2016-06-03 DIAGNOSIS — E042 Nontoxic multinodular goiter: Secondary | ICD-10-CM | POA: Diagnosis present

## 2016-06-03 HISTORY — PX: THYROIDECTOMY: SHX17

## 2016-06-03 LAB — PREGNANCY, URINE: Preg Test, Ur: NEGATIVE

## 2016-06-03 SURGERY — THYROIDECTOMY
Anesthesia: General | Site: Neck

## 2016-06-03 MED ORDER — PROPOFOL 10 MG/ML IV BOLUS
INTRAVENOUS | Status: AC
  Filled 2016-06-03: qty 20

## 2016-06-03 MED ORDER — MIDAZOLAM HCL 5 MG/5ML IJ SOLN
INTRAMUSCULAR | Status: DC | PRN
  Administered 2016-06-03: 2 mg via INTRAVENOUS

## 2016-06-03 MED ORDER — ONDANSETRON HCL 4 MG/2ML IJ SOLN
INTRAMUSCULAR | Status: AC
  Filled 2016-06-03: qty 2

## 2016-06-03 MED ORDER — SCOPOLAMINE 1 MG/3DAYS TD PT72
1.0000 | MEDICATED_PATCH | Freq: Once | TRANSDERMAL | Status: DC
  Administered 2016-06-03: 1.5 mg via TRANSDERMAL

## 2016-06-03 MED ORDER — DEXAMETHASONE SODIUM PHOSPHATE 10 MG/ML IJ SOLN
INTRAMUSCULAR | Status: DC | PRN
  Administered 2016-06-03: 10 mg via INTRAVENOUS

## 2016-06-03 MED ORDER — ONDANSETRON HCL 4 MG/2ML IJ SOLN
4.0000 mg | Freq: Four times a day (QID) | INTRAMUSCULAR | Status: DC | PRN
  Administered 2016-06-03 – 2016-06-04 (×3): 4 mg via INTRAVENOUS
  Filled 2016-06-03 (×3): qty 2

## 2016-06-03 MED ORDER — HYDROMORPHONE HCL 1 MG/ML IJ SOLN
1.0000 mg | INTRAMUSCULAR | Status: DC | PRN
  Filled 2016-06-03: qty 1

## 2016-06-03 MED ORDER — LACTATED RINGERS IV SOLN: INTRAVENOUS | Status: DC

## 2016-06-03 MED ORDER — HYDROMORPHONE HCL 1 MG/ML IJ SOLN
INTRAMUSCULAR | Status: DC | PRN
  Administered 2016-06-03: 0.5 mg via INTRAVENOUS

## 2016-06-03 MED ORDER — PROMETHAZINE HCL 25 MG/ML IJ SOLN
6.2500 mg | INTRAMUSCULAR | Status: DC | PRN
  Administered 2016-06-03: 12.5 mg via INTRAVENOUS

## 2016-06-03 MED ORDER — CIPROFLOXACIN IN D5W 400 MG/200ML IV SOLN
INTRAVENOUS | Status: AC
Start: 2016-06-03 — End: 2016-06-03
  Filled 2016-06-03: qty 200

## 2016-06-03 MED ORDER — ACETAMINOPHEN 650 MG RE SUPP: 650.0000 mg | Freq: Four times a day (QID) | RECTAL | Status: DC | PRN

## 2016-06-03 MED ORDER — PROPOFOL 10 MG/ML IV BOLUS
INTRAVENOUS | Status: DC | PRN
  Administered 2016-06-03: 20 mg via INTRAVENOUS
  Administered 2016-06-03: 30 mg via INTRAVENOUS
  Administered 2016-06-03: 150 mg via INTRAVENOUS

## 2016-06-03 MED ORDER — LIDOCAINE HCL (CARDIAC) 20 MG/ML IV SOLN
INTRAVENOUS | Status: AC
  Filled 2016-06-03: qty 5

## 2016-06-03 MED ORDER — CALCIUM CARBONATE 1250 (500 CA) MG PO TABS
2.0000 | ORAL_TABLET | Freq: Three times a day (TID) | ORAL | Status: DC
  Administered 2016-06-03: 1000 mg via ORAL
  Filled 2016-06-03 (×2): qty 1

## 2016-06-03 MED ORDER — MIDAZOLAM HCL 2 MG/2ML IJ SOLN
INTRAMUSCULAR | Status: AC
  Filled 2016-06-03: qty 2

## 2016-06-03 MED ORDER — 0.9 % SODIUM CHLORIDE (POUR BTL) OPTIME
TOPICAL | Status: DC | PRN
  Administered 2016-06-03: 1000 mL

## 2016-06-03 MED ORDER — LABETALOL HCL 5 MG/ML IV SOLN
INTRAVENOUS | Status: AC
  Filled 2016-06-03: qty 4

## 2016-06-03 MED ORDER — PROMETHAZINE HCL 25 MG/ML IJ SOLN
INTRAMUSCULAR | Status: AC
  Filled 2016-06-03: qty 1

## 2016-06-03 MED ORDER — ONDANSETRON HCL 4 MG/2ML IJ SOLN
INTRAMUSCULAR | Status: DC | PRN
  Administered 2016-06-03 (×2): 4 mg via INTRAVENOUS

## 2016-06-03 MED ORDER — ROCURONIUM BROMIDE 100 MG/10ML IV SOLN
INTRAVENOUS | Status: DC | PRN
  Administered 2016-06-03: 10 mg via INTRAVENOUS
  Administered 2016-06-03: 40 mg via INTRAVENOUS

## 2016-06-03 MED ORDER — FENTANYL CITRATE (PF) 100 MCG/2ML IJ SOLN
INTRAMUSCULAR | Status: DC | PRN
  Administered 2016-06-03: 100 ug via INTRAVENOUS
  Administered 2016-06-03: 50 ug via INTRAVENOUS
  Administered 2016-06-03: 100 ug via INTRAVENOUS

## 2016-06-03 MED ORDER — HYDROMORPHONE HCL 2 MG/ML IJ SOLN
INTRAMUSCULAR | Status: AC
  Filled 2016-06-03: qty 1

## 2016-06-03 MED ORDER — KCL IN DEXTROSE-NACL 20-5-0.45 MEQ/L-%-% IV SOLN
INTRAVENOUS | Status: DC
  Administered 2016-06-03 – 2016-06-04 (×2): via INTRAVENOUS
  Filled 2016-06-03 (×2): qty 1000

## 2016-06-03 MED ORDER — SUGAMMADEX SODIUM 200 MG/2ML IV SOLN
INTRAVENOUS | Status: AC
  Filled 2016-06-03: qty 2

## 2016-06-03 MED ORDER — LIDOCAINE HCL (CARDIAC) 20 MG/ML IV SOLN
INTRAVENOUS | Status: DC | PRN
  Administered 2016-06-03: 100 mg via INTRAVENOUS

## 2016-06-03 MED ORDER — LACTATED RINGERS IV SOLN
INTRAVENOUS | Status: DC
  Administered 2016-06-03: 1000 mL via INTRAVENOUS
  Administered 2016-06-03: 11:00:00 via INTRAVENOUS

## 2016-06-03 MED ORDER — HYDROMORPHONE HCL 1 MG/ML IJ SOLN: 0.2500 mg | INTRAMUSCULAR | Status: DC | PRN

## 2016-06-03 MED ORDER — SCOPOLAMINE 1 MG/3DAYS TD PT72
MEDICATED_PATCH | TRANSDERMAL | Status: AC
  Filled 2016-06-03: qty 1

## 2016-06-03 MED ORDER — LABETALOL HCL 5 MG/ML IV SOLN
5.0000 mg | INTRAVENOUS | Status: AC | PRN
  Administered 2016-06-03 (×2): 5 mg via INTRAVENOUS

## 2016-06-03 MED ORDER — ROCURONIUM BROMIDE 100 MG/10ML IV SOLN
INTRAVENOUS | Status: AC
  Filled 2016-06-03: qty 1

## 2016-06-03 MED ORDER — FENTANYL CITRATE (PF) 250 MCG/5ML IJ SOLN
INTRAMUSCULAR | Status: AC
  Filled 2016-06-03: qty 5

## 2016-06-03 MED ORDER — DEXAMETHASONE SODIUM PHOSPHATE 10 MG/ML IJ SOLN
INTRAMUSCULAR | Status: AC
  Filled 2016-06-03: qty 1

## 2016-06-03 MED ORDER — PROPOFOL 500 MG/50ML IV EMUL
INTRAVENOUS | Status: DC | PRN
  Administered 2016-06-03: 15 ug/kg/min via INTRAVENOUS

## 2016-06-03 MED ORDER — OXYCODONE HCL 5 MG PO TABS
5.0000 mg | ORAL_TABLET | ORAL | Status: DC | PRN
  Administered 2016-06-03: 5 mg via ORAL
  Administered 2016-06-03: 10 mg via ORAL
  Administered 2016-06-03: 5 mg via ORAL
  Administered 2016-06-04: 10 mg via ORAL
  Administered 2016-06-04 (×2): 5 mg via ORAL
  Filled 2016-06-03: qty 1
  Filled 2016-06-03: qty 2
  Filled 2016-06-03 (×2): qty 1
  Filled 2016-06-03 (×3): qty 2

## 2016-06-03 MED ORDER — PROMETHAZINE HCL 25 MG/ML IJ SOLN
25.0000 mg | Freq: Four times a day (QID) | INTRAMUSCULAR | Status: DC | PRN
  Administered 2016-06-03 – 2016-06-04 (×2): 25 mg via INTRAVENOUS
  Filled 2016-06-03 (×4): qty 1

## 2016-06-03 MED ORDER — CIPROFLOXACIN IN D5W 400 MG/200ML IV SOLN
400.0000 mg | INTRAVENOUS | Status: AC
  Administered 2016-06-03: 400 mg via INTRAVENOUS

## 2016-06-03 MED ORDER — MEPERIDINE HCL 50 MG/ML IJ SOLN: 6.2500 mg | INTRAMUSCULAR | Status: DC | PRN

## 2016-06-03 MED ORDER — ONDANSETRON 4 MG PO TBDP
4.0000 mg | ORAL_TABLET | Freq: Four times a day (QID) | ORAL | Status: DC | PRN
  Administered 2016-06-04: 4 mg via ORAL
  Filled 2016-06-03: qty 1

## 2016-06-03 MED ORDER — ACETAMINOPHEN 325 MG PO TABS: 650.0000 mg | ORAL_TABLET | Freq: Four times a day (QID) | ORAL | Status: DC | PRN

## 2016-06-03 SURGICAL SUPPLY — 41 items
APL SKNCLS STERI-STRIP NONHPOA (GAUZE/BANDAGES/DRESSINGS)
ATTRACTOMAT 16X20 MAGNETIC DRP (DRAPES) ×2 IMPLANT
BENZOIN TINCTURE PRP APPL 2/3 (GAUZE/BANDAGES/DRESSINGS) IMPLANT
BLADE SURG 15 STRL LF DISP TIS (BLADE) ×1 IMPLANT
BLADE SURG 15 STRL SS (BLADE) ×2
CHLORAPREP W/TINT 26ML (MISCELLANEOUS) ×2 IMPLANT
CLIP TI MEDIUM 6 (CLIP) ×5 IMPLANT
CLIP TI WIDE RED SMALL 6 (CLIP) ×6 IMPLANT
COVER SURGICAL LIGHT HANDLE (MISCELLANEOUS) ×2 IMPLANT
DISSECTOR ROUND CHERRY 3/8 STR (MISCELLANEOUS) IMPLANT
DRAPE LAPAROTOMY T 98X78 PEDS (DRAPES) ×2 IMPLANT
DRESSING SURGICEL FIBRLLR 1X2 (HEMOSTASIS) ×1 IMPLANT
DRSG SURGICEL FIBRILLAR 1X2 (HEMOSTASIS) ×4
ELECT PENCIL ROCKER SW 15FT (MISCELLANEOUS) ×2 IMPLANT
ELECT REM PT RETURN 9FT ADLT (ELECTROSURGICAL) ×2
ELECTRODE REM PT RTRN 9FT ADLT (ELECTROSURGICAL) ×1 IMPLANT
GAUZE SPONGE 4X4 12PLY STRL (GAUZE/BANDAGES/DRESSINGS) ×1 IMPLANT
GAUZE SPONGE 4X4 16PLY XRAY LF (GAUZE/BANDAGES/DRESSINGS) ×3 IMPLANT
GLOVE SURG ORTHO 8.0 STRL STRW (GLOVE) ×2 IMPLANT
GLOVE SURG SIGNA 7.5 PF LTX (GLOVE) ×1 IMPLANT
GLOVE SURG SS PI 6.5 STRL IVOR (GLOVE) ×1 IMPLANT
GOWN STRL REUS W/TWL LRG LVL3 (GOWN DISPOSABLE) ×1 IMPLANT
GOWN STRL REUS W/TWL XL LVL3 (GOWN DISPOSABLE) ×4 IMPLANT
ILLUMINATOR WAVEGUIDE N/F (MISCELLANEOUS) IMPLANT
KIT BASIN OR (CUSTOM PROCEDURE TRAY) ×2 IMPLANT
LIGHT WAVEGUIDE WIDE FLAT (MISCELLANEOUS) ×1 IMPLANT
PACK BASIC VI WITH GOWN DISP (CUSTOM PROCEDURE TRAY) ×2 IMPLANT
SHEARS HARMONIC 9CM CVD (BLADE) ×2 IMPLANT
STAPLER VISISTAT 35W (STAPLE) IMPLANT
STRIP CLOSURE SKIN 1/2X4 (GAUZE/BANDAGES/DRESSINGS) ×2 IMPLANT
SUT MNCRL AB 4-0 PS2 18 (SUTURE) ×2 IMPLANT
SUT SILK 2 0 (SUTURE)
SUT SILK 2-0 18XBRD TIE 12 (SUTURE) IMPLANT
SUT SILK 3 0 (SUTURE)
SUT SILK 3-0 18XBRD TIE 12 (SUTURE) IMPLANT
SUT VIC AB 3-0 SH 18 (SUTURE) ×4 IMPLANT
SYR BULB IRRIGATION 50ML (SYRINGE) ×2 IMPLANT
TAPE CLOTH SURG 4X10 WHT LF (GAUZE/BANDAGES/DRESSINGS) ×1 IMPLANT
TOWEL OR 17X26 10 PK STRL BLUE (TOWEL DISPOSABLE) ×2 IMPLANT
TOWEL OR NON WOVEN STRL DISP B (DISPOSABLE) ×2 IMPLANT
YANKAUER SUCT BULB TIP 10FT TU (MISCELLANEOUS) ×2 IMPLANT

## 2016-06-03 NOTE — Interval H&P Note (Signed)
History and Physical Interval Note:  06/03/2016 9:30 AM  Janice Mendez  has presented today for surgery, with the diagnosis of Thyroid neoplasm of uncertain behavior.   The various methods of treatment have been discussed with the patient and family. After consideration of risks, benefits and other options for treatment, the patient has consented to    Procedure(s): TOTAL THYROIDECTOMY (N/A) as a surgical intervention .    The patient's history has been reviewed, patient examined, no change in status, stable for surgery.  I have reviewed the patient's chart and labs.  Questions were answered to the patient's satisfaction.    Earnstine Regal, MD, [facility] Office: [place]

## 2016-06-03 NOTE — Op Note (Signed)
Procedure Note  Pre-operative Diagnosis:  Thyroid neoplasm of uncertain behavior, multiple thyroid nodules  Post-operative Diagnosis:  same  Surgeon:  Earnstine Regal, MD, FACS  Assistant:  Coralie Keens, MD, FACS   Procedure:  Total thyroidectomy  Anesthesia:  General  Estimated Blood Loss:  minimal  Drains: none         Specimen: thyroid to pathology  Indications:  Patient is referred by Dr. Jacelyn Pi for evaluation of multiple thyroid nodules, thyroid goiter, and thyroid neoplasm of undetermined behavior. Patient's primary care physician is Dr. Annye Asa. Patient initially presented to her primary care provider with complaints of fatigue, low energy. Evaluation included physical examination and laboratory studies. Patient underwent a thyroid ultrasound in April 2017 showing an enlarged thyroid gland with the right lobe measuring 6.2 cm and left lobe measuring 7.1 cm. Bilateral thyroid nodules were noted. Dominant nodule was in the left inferior pole measuring 2.3 cm and containing coarse calcifications. Fine-needle aspiration biopsies were performed and the dominant nodule demonstrated cytologic atypia, Bethesda category IV. Subsequent molecular genetic testing by [facility] revealed a suspicious lesion with a risk of malignancy of approximately 40%. Patient is now referred for consideration for thyroidectomy for definitive diagnosis and management.  Procedure Details: Procedure was done in OR #4 at the [facility].  The patient was brought to the operating room and placed in a supine position on the operating room table.  Following administration of general anesthesia, the patient was positioned and then prepped and draped in the usual aseptic fashion.  After ascertaining that an adequate level of anesthesia had been achieved, a Kocher incision was made with #15 blade.  Dissection was carried through subcutaneous tissues and platysma. Hemostasis was achieved with  the electrocautery.  Skin flaps were elevated cephalad and caudad from the thyroid notch to the sternal notch.  The [place] self-retaining retractor was placed for exposure.  Strap muscles were incised in the midline and dissection was begun on the left side.  Strap muscles were reflected laterally.  Left thyroid lobe was enlarged with a dominant firm mass in the inferior pole.  The left lobe was gently mobilized with blunt dissection.  Superior pole vessels were dissected out and divided individually between small and medium Ligaclips with the Harmonic scalpel.  The thyroid lobe was rolled anteriorly.  Branches of the inferior thyroid artery were divided between small Ligaclips with the Harmonic scalpel.  Inferior venous tributaries were divided between Ligaclips.  Both the superior and inferior parathyroid glands were identified and preserved on their vascular pedicles.  The recurrent laryngeal nerve was identified and preserved along its course.  The ligament of Gwenlyn Found was released with the electrocautery and the gland was mobilized onto the anterior trachea. Isthmus was mobilized across the midline.  There was a moderate sized pyramidal lobe present which was dissected off the thyroid cartilage and resected with the isthmus.  Dry pack was placed in the left neck.  Next, the right thyroid lobe was gently mobilized with blunt dissection.  Right thyroid lobe was enlarged with multiple nodules.  Superior pole vessels were dissected out and divided between small and medium Ligaclips with the Harmonic scalpel.  Superior parathyroid was identified and preserved.  Inferior venous tributaries were divided between medium Ligaclips with the Harmonic scalpel.  The right thyroid lobe was rolled anteriorly and the branches of the inferior thyroid artery divided between small Ligaclips.  The right recurrent laryngeal nerve was identified and preserved along its course.  The ligament  of Gwenlyn Found was released with the  electrocautery.  The right thyroid lobe was mobilized onto the anterior trachea and the remainder of the thyroid was dissected off the anterior trachea and the thyroid was completely excised.  A suture was used to mark the left lobe. The entire thyroid gland was submitted to pathology for review.  The neck was irrigated with warm saline.  Fibrillar was placed throughout the operative field.  Strap muscles were reapproximated in the midline with interrupted 3-0 Vicryl sutures.  Platysma was closed with interrupted 3-0 Vicryl sutures.  Skin was closed with a running 4-0 Monocryl subcuticular suture.  Wound was washed and dried and benzoin and steri-strips were applied.  Dry gauze dressing was placed.  The patient was awakened from anesthesia and brought to the recovery room.  The patient tolerated the procedure well.   Earnstine Regal, MD, [facility] Office: [phone]

## 2016-06-03 NOTE — Transfer of Care (Signed)
Immediate Anesthesia Transfer of Care Note  Patient: Janice Mendez  Procedure(s) Performed: Procedure(s): TOTAL THYROIDECTOMY WITH LYMPH NODE REMOVAL (N/A)  Patient Location: PACU  Anesthesia Type:General  Level of Consciousness: sedated  Airway & Oxygen Therapy: Patient Spontanous Breathing and Patient connected to face mask oxygen  Post-op Assessment: Report given to RN and Post -op Vital signs reviewed and stable  Post vital signs: Reviewed and stable  Last Vitals:  Filed Vitals:   06/03/16 0741  BP: 147/96  Pulse: 88  Temp: 36.9 C  Resp: 16    Last Pain: There were no vitals filed for this visit.    Patients Stated Pain Goal: 4 ([id number])  Complications: No apparent anesthesia complications

## 2016-06-03 NOTE — Anesthesia Procedure Notes (Signed)
Procedure Name: Intubation Date/Time: 06/03/2016 9:47 AM Performed by: Lind Covert Pre-anesthesia Checklist: Patient identified, Emergency Drugs available, Suction available, Patient being monitored and Timeout performed Patient Re-evaluated:Patient Re-evaluated prior to inductionOxygen Delivery Method: Circle system utilized Preoxygenation: Pre-oxygenation with 100% oxygen Intubation Type: IV induction Ventilation: Mask ventilation without difficulty Laryngoscope Size: Mac and 3 Grade View: Grade III Tube type: Oral Number of attempts: 3 Airway Equipment and Method: Bougie stylet Placement Confirmation: ETT inserted through vocal cords under direct vision,  positive ETCO2 and breath sounds checked- equal and bilateral Secured at: 21 cm Tube secured with: Tape Dental Injury: Teeth and Oropharynx as per pre-operative assessment  Difficulty Due To: Difficulty was unanticipated, Difficult Airway- due to immobile epiglottis and Difficult Airway- due to anterior larynx Future Recommendations: Recommend- induction with short-acting agent, and alternative techniques readily available

## 2016-06-03 NOTE — Progress Notes (Signed)
Patient tolerated full liquids. Advanced to soft bland diet. Patient had 1 large episode of emesis after eating 3 bites of potatoes. MD notified.  Barbee Shropshire. Brigitte Pulse, RN

## 2016-06-03 NOTE — Anesthesia Postprocedure Evaluation (Signed)
Anesthesia Post Note  Patient: Janice Mendez  Procedure(s) Performed: Procedure(s) (LRB): TOTAL THYROIDECTOMY WITH LYMPH NODE REMOVAL (N/A)  Patient location during evaluation: PACU Anesthesia Type: General Level of consciousness: awake and alert Pain management: pain level controlled Vital Signs Assessment: post-procedure vital signs reviewed and stable Respiratory status: spontaneous breathing, nonlabored ventilation, respiratory function stable and patient connected to nasal cannula oxygen Cardiovascular status: blood pressure returned to baseline and stable Postop Assessment: no signs of nausea or vomiting Anesthetic complications: no    Last Vitals:  Filed Vitals:   06/03/16 1315 06/03/16 1324  BP: 181/99 164/98  Pulse: 66   Temp: 36.6 C   Resp: 16     Last Pain:  Filed Vitals:   06/03/16 1324  PainSc: 0-No pain                 Effie Berkshire

## 2016-06-03 NOTE — Anesthesia Preprocedure Evaluation (Addendum)
Anesthesia Evaluation  Patient identified by MRN, date of birth, ID band Patient awake    Reviewed: Allergy & Precautions, NPO status , Patient's Chart, lab work & pertinent test results  History of Anesthesia Complications (+) PONV and history of anesthetic complications  Airway Mallampati: II  TM Distance: >3 FB Neck ROM: Full    Dental  (+) Teeth Intact, Dental Advisory Given   Pulmonary Current Smoker,    breath sounds clear to auscultation       Cardiovascular negative cardio ROS   Rhythm:Regular Rate:Normal     Neuro/Psych  Headaches, negative psych ROS   GI/Hepatic negative GI ROS, Neg liver ROS,   Endo/Other  negative endocrine ROS  Renal/GU negative Renal ROS  negative genitourinary   Musculoskeletal negative musculoskeletal ROS (+)   Abdominal   Peds negative pediatric ROS (+)  Hematology negative hematology ROS (+)   Anesthesia Other Findings   Reproductive/Obstetrics negative OB ROS                            Anesthesia Physical Anesthesia Plan  ASA: II  Anesthesia Plan: General   Post-op Pain Management:    Induction: Intravenous  Airway Management Planned: Oral ETT  Additional Equipment:   Intra-op Plan:   Post-operative Plan: Extubation in OR  Informed Consent: I have reviewed the patients History and Physical, chart, labs and discussed the procedure including the risks, benefits and alternatives for the proposed anesthesia with the patient or authorized representative who has indicated his/her understanding and acceptance.   Dental advisory given  Plan Discussed with: CRNA  Anesthesia Plan Comments:         Anesthesia Quick Evaluation

## 2016-06-04 ENCOUNTER — Encounter (HOSPITAL_COMMUNITY): Payer: Self-pay | Admitting: Surgery

## 2016-06-04 DIAGNOSIS — C73 Malignant neoplasm of thyroid gland: Secondary | ICD-10-CM | POA: Diagnosis not present

## 2016-06-04 LAB — BASIC METABOLIC PANEL
Anion gap: 10 (ref 5–15)
Anion gap: 10 (ref 5–15)
BUN: 6 mg/dL (ref 6–20)
BUN: 7 mg/dL (ref 6–20)
CO2: 26 mmol/L (ref 22–32)
CO2: 29 mmol/L (ref 22–32)
Calcium: 7.2 mg/dL — ABNORMAL LOW (ref 8.9–10.3)
Calcium: 8.2 mg/dL — ABNORMAL LOW (ref 8.9–10.3)
Chloride: 100 mmol/L — ABNORMAL LOW (ref 101–111)
Chloride: 106 mmol/L (ref 101–111)
Creatinine, Ser: 0.77 mg/dL (ref 0.44–1.00)
Creatinine, Ser: 1.11 mg/dL — ABNORMAL HIGH (ref 0.44–1.00)
GFR calc Af Amer: >60 mL/min (ref >60)
GFR calc Af Amer: >60 mL/min (ref >60)
GFR calc non Af Amer: >60 mL/min (ref >60)
GFR calc non Af Amer: 58 mL/min — ABNORMAL LOW (ref >60)
Glucose, Bld: 126 mg/dL — ABNORMAL HIGH (ref 65–99)
Glucose, Bld: 80 mg/dL (ref 65–99)
Potassium: 3.4 mmol/L — ABNORMAL LOW (ref 3.5–5.1)
Potassium: 3 mmol/L — ABNORMAL LOW (ref 3.5–5.1)
Sodium: 136 mmol/L (ref 135–145)
Sodium: 145 mmol/L (ref 135–145)

## 2016-06-04 MED ORDER — CALCIUM CARBONATE 1250 (500 CA) MG PO TABS
3.0000 | ORAL_TABLET | Freq: Four times a day (QID) | ORAL | Status: DC
  Administered 2016-06-04: 1500 mg via ORAL
  Filled 2016-06-04: qty 2

## 2016-06-04 MED ORDER — CALCITRIOL 0.25 MCG PO CAPS
0.2500 ug | ORAL_CAPSULE | ORAL | Status: AC
Start: 2016-06-04 — End: 2016-06-04
  Administered 2016-06-04: 0.25 ug via ORAL
  Filled 2016-06-04: qty 1

## 2016-06-04 MED ORDER — SODIUM CHLORIDE 0.9 % IV SOLN
2.0000 g | INTRAVENOUS | Status: AC
  Administered 2016-06-04: 2 g via INTRAVENOUS
  Filled 2016-06-04: qty 20

## 2016-06-04 MED ORDER — CALCITRIOL 0.25 MCG PO CAPS: 0.2500 ug | ORAL_CAPSULE | Freq: Every day | ORAL | Status: DC

## 2016-06-04 MED ORDER — OXYCODONE HCL 5 MG PO TABS: 5.0000 mg | ORAL_TABLET | ORAL | Status: DC | PRN

## 2016-06-04 MED ORDER — SYNTHROID 100 MCG PO TABS: 100.0000 ug | ORAL_TABLET | Freq: Every day | ORAL | Status: DC

## 2016-06-04 MED ORDER — PROMETHAZINE HCL 25 MG PO TABS: 25.0000 mg | ORAL_TABLET | Freq: Four times a day (QID) | ORAL | Status: DC | PRN

## 2016-06-04 NOTE — Care Management Note (Signed)
Case Management Note  Patient Details  Name: UTAHNA ZUKAUSKAS MRN: [id number] Date of Birth: [birth year]  Subjective/Objective: 50 y/o f admitted w/Neoplasm of thyroid gland.s/p thyroidectomy. From home.                   Action/Plan:d/c plan home.   Expected Discharge Date:                  Expected Discharge Plan:  Home/Self Care  In-House Referral:     Discharge planning Services  CM Consult  Post Acute Care Choice:    Choice offered to:     DME Arranged:    DME Agency:     HH Arranged:    HH Agency:     Status of Service:  In process, will continue to follow  If discussed at Long Length of Stay Meetings, dates discussed:    Additional Comments:  Dessa Phi, RN 06/04/2016, 12:49 PM

## 2016-06-04 NOTE — Progress Notes (Signed)
Patient ID: Janice Mendez, female   DOB: [birth year], 50 y.o.   MRN: [id number]  [facility]  Subjective: POD#1 - patient with nausea, emesis last night.  Mild paresthesias around mouth.  Voice normal.  Objective: Vital signs in last 24 hours: Temp:  [96.8 F (36 C)-98.3 F (36.8 C)] 98.1 F (36.7 C) (06/23 [id number]) Pulse Rate:  [60-113] 60 (06/23 0608) Resp:  [11-20] 18 (06/23 0608) BP: (114-181)/(70-134) 114/70 mmHg (06/23 0608) SpO2:  [95 %-100 %] 95 % (06/23 [id number])    Intake/Output from previous day: 06/22 0701 - 06/23 0700 In: 3025 [P.O.:420; I.V.:2605] Out: 500 [Urine:400; Blood:100] Intake/Output this shift:    Physical Exam: HEENT - sclerae clear, mucous membranes moist Neck - wound dry and intact; mild STS; voice normal Ext - no edema, non-tender Neuro - alert & oriented, no focal deficits  Lab Results:  No results for input(s): WBC, HGB, HCT, PLT in the last 72 hours. BMET  Recent Labs  06/04/16 0337  NA 136  K 3.4*  CL 100*  CO2 26  GLUCOSE 126*  BUN 6  CREATININE 0.77  CALCIUM 7.2*   PT/INR No results for input(s): LABPROT, INR in the last 72 hours. Comprehensive Metabolic Panel:    Component Value Date/Time   NA 136 06/04/2016 0337   NA 138 02/19/2016 1200   K 3.4* 06/04/2016 0337   K 4.2 02/19/2016 1200   CL 100* 06/04/2016 0337   CL 103 02/19/2016 1200   CO2 26 06/04/2016 0337   CO2 28 02/19/2016 1200   BUN 6 06/04/2016 0337   BUN 13 02/19/2016 1200   CREATININE 0.77 06/04/2016 0337   CREATININE 0.83 02/19/2016 1200   GLUCOSE 126* 06/04/2016 0337   GLUCOSE 83 02/19/2016 1200   CALCIUM 7.2* 06/04/2016 0337   CALCIUM 8.8 02/19/2016 1200   AST 18 02/19/2016 1200   ALT 17 02/19/2016 1200   ALKPHOS 56 02/19/2016 1200   BILITOT 0.4 02/19/2016 1200   PROT 6.6 02/19/2016 1200   ALBUMIN 3.7 02/19/2016 1200    Studies/Results: No results found.  Assessment & Plans: Status post total  thyroidectomy Post op hypocalcemia  Give IV calcium gluconate now  Begin Rocaltrol po  Increase po calcium carbonate to QID  Will re-check calcium this afternoon and decide on discharge  Discussed with patient and husband at bedside.  Earnstine Regal, MD, [facility] Office: [place] 06/04/2016

## 2016-06-04 NOTE — Discharge Summary (Signed)
Physician Discharge Summary [facility]  Patient ID: Janice Mendez MRN: [id number] DOB/AGE: [birth year] 50 y.o.  Admit date: 06/03/2016 Discharge date: 06/04/2016  Admission Diagnoses:  Thyroid neoplasm of uncertain behavior, multiple thyroid nodules  Discharge Diagnoses:  Principal Problem:   Neoplasm of uncertain behavior of thyroid gland Active Problems:   Multiple thyroid nodules   Discharged Condition: good  Hospital Course: Patient was admitted for observation following thyroid surgery.  Post op course was uncomplicated.  Pain was well controlled.  Tolerated diet.  Post op calcium level on morning following surgery was 7.2 mg/dl.  Patient received IV calcium gluconate 2 gm and po calcium and Rocaltrol prior to discharge.  Follow up calcium level was 8.2 mg/dl.  Patient was prepared for discharge home on POD#1.  Consults: None  Treatments: surgery: total thyroidectomy  Discharge Exam: Blood pressure 130/74, pulse 75, temperature 98.7 F (37.1 C), temperature source Oral, resp. rate 16, height 5\' 7"  (1.702 m), weight 79.833 kg (176 lb), last menstrual period 05/13/2016, SpO2 97 %. See progress notes.   Disposition: Home  Discharge Instructions    Apply dressing    Complete by:  As directed   Apply light gauze dressing to wound before discharge home today.     Diet - low sodium heart healthy    Complete by:  As directed      Increase activity slowly    Complete by:  As directed      Remove dressing in 24 hours    Complete by:  As directed             Medication List    STOP taking these medications        levothyroxine 50 MCG tablet  Commonly known as:  SYNTHROID, LEVOTHROID  Replaced by:  SYNTHROID 100 MCG tablet      TAKE these medications        ADVIL 200 MG tablet  Generic drug:  ibuprofen  Take 600 mg by mouth every 4 (four) hours as needed (For menstrual pain.).     calcitRIOL 0.25 MCG capsule  Commonly known as:  ROCALTROL   Take 1 capsule (0.25 mcg total) by mouth daily.     GOODYS EXTRA STRENGTH 520-260-32.5 MG Pack  Generic drug:  Aspirin-Acetaminophen-Caffeine  Take 1 packet by mouth daily.     loratadine 10 MG tablet  Commonly known as:  CLARITIN  Take 10 mg by mouth daily.     nitrofurantoin 50 MG capsule  Commonly known as:  MACRODANTIN  Take as needed with coitus     OMEGA 3 PO  Take 1 capsule by mouth daily.     oxyCODONE 5 MG immediate release tablet  Commonly known as:  Oxy IR/ROXICODONE  Take 1-2 tablets (5-10 mg total) by mouth every 4 (four) hours as needed for moderate pain.     promethazine 25 MG tablet  Commonly known as:  PHENERGAN  Take 1 tablet (25 mg total) by mouth every 6 (six) hours as needed for nausea or vomiting.     REFRESH OP  Place 1 drop into both eyes as needed (For dry eyes.).     sulfamethoxazole-trimethoprim 800-160 MG tablet  Commonly known as:  BACTRIM DS,SEPTRA DS  Take 1 tablet by mouth 2 (two) times daily.     SYNTHROID 100 MCG tablet  Generic drug:  levothyroxine  Take 1 tablet (100 mcg total) by mouth daily.     triamcinolone 55 MCG/ACT Aero nasal inhaler  Commonly known as:  NASACORT  Place 2 sprays into the nose daily as needed (For allergies.).     Vitamin B-12 5000 MCG Tbdp  Take 5,000 mcg by mouth daily.         Earnstine Regal, MD, [facility] Office: [phone]   Signed: Earnstine Regal 06/04/2016, 3:07 PM

## 2016-06-04 NOTE — Discharge Instructions (Signed)
[facility]  THYROID & PARATHYROID SURGERY:  POST-OP INSTRUCTIONS  Always review your discharge instruction sheet from the facility where your surgery was performed.  A prescription for pain medication may be given to you upon discharge.  Take your pain medication as prescribed.  If narcotic pain medicine is not needed, then you may take acetaminophen (Tylenol) or ibuprofen (Advil) as needed.  Take your usually prescribed medications unless otherwise directed.  If you need a refill on your pain medication, please contact your pharmacy. They will contact our office to request authorization.  Prescriptions will not be processed by our office after 5 pm or on weekends.  Start with a light diet upon arrival home, such as soup and crackers or toast.  Be sure to drink plenty of fluids daily.  Resume your normal diet the day after surgery.  Most patients will experience some swelling and bruising on the chest and neck area.  Ice packs will help.  Swelling and bruising can take several days to resolve.   It is common to experience some constipation after surgery.  Increasing fluid intake and taking a stool softener will usually help or prevent this problem.  A mild laxative (Milk of Magnesia or Miralax) should be taken according to package directions if there has been no bowel movement after 48 hours.  You have steri-strips and a gauze dressing over your incision.  You may remove the gauze bandage on the second day after surgery, and you may shower at that time.  Leave your steri-strips (small skin tapes) in place directly over the incision.  These strips should remain on the skin for 5-7 days and then be removed.  You may get them wet in the shower and pat them dry.  You may resume regular (light) daily activities beginning the next day - such as daily self-care, walking, climbing stairs - gradually increasing activities as tolerated.  You may have sexual intercourse when it is  comfortable.  Refrain from any heavy lifting or straining until approved by your doctor.  You may drive when you no longer are taking prescription pain medication, you can comfortably wear a seatbelt, and you can safely maneuver your car and apply brakes.  You should see your doctor in the office for a follow-up appointment approximately two to three weeks after your surgery.  Make sure that you call for this appointment within a day or two after you arrive home to insure a convenient appointment time.  WHEN TO CALL YOUR DOCTOR: -- Fever greater than 101.5 -- Inability to urinate -- Nausea and/or vomiting - persistent -- Extreme swelling or bruising -- Continued bleeding from incision -- Increased pain, redness, or drainage from the incision -- Difficulty swallowing or breathing -- Muscle cramping or spasms -- Numbness or tingling in hands or around lips  CALCIUM SUPPLEMENTATION Take 3 TUMS four times a day for the next two weeks.  The clinic staff is available to answer your questions during regular business hours.  Please dont hesitate to call and ask to speak to one of the nurses if you have concerns.  Earnstine Regal, MD, [facility] Office: [phone]  Website: [email or web address]

## 2016-06-04 NOTE — Care Management Note (Signed)
Case Management Note  Patient Details  Name: Janice Mendez MRN: [id number] Date of Birth: 24-Jun-1966  Subjective/Objective:                    Action/Plan:d/c home no needs or orders.   Expected Discharge Date:                  Expected Discharge Plan:  Home/Self Care  In-House Referral:     Discharge planning Services  CM Consult  Post Acute Care Choice:    Choice offered to:     DME Arranged:    DME Agency:     HH Arranged:    [place] Agency:     Status of Service:  Completed, signed off  If discussed at [employer] of Stay Meetings, dates discussed:    Additional Comments:  Dessa Phi, RN 06/04/2016, 3:14 PM

## 2016-06-10 ENCOUNTER — Telehealth: Payer: Self-pay | Admitting: Family Medicine

## 2016-06-10 DIAGNOSIS — E876 Hypokalemia: Secondary | ICD-10-CM

## 2016-06-10 DIAGNOSIS — R7989 Other specified abnormal findings of blood chemistry: Secondary | ICD-10-CM

## 2016-06-10 NOTE — Telephone Encounter (Signed)
Ca was mildly low but K+ was quite low on 6/23.  She can schedule a lab visit for repeat BMP to recheck both.

## 2016-06-10 NOTE — Telephone Encounter (Signed)
Pt is asking if she can come her to have calcium checked. Please advise

## 2016-06-10 NOTE — Telephone Encounter (Signed)
Spoke with pt who advised that someone from our office had already contacted her and scheduled her for labs at [facility]. Labs were ordered for pt.

## 2016-06-10 NOTE — Telephone Encounter (Signed)
Please advise BMP was drawn in the hospital and Calcium was 8.2. This was completed on 06/04/16

## 2016-06-14 ENCOUNTER — Other Ambulatory Visit (INDEPENDENT_AMBULATORY_CARE_PROVIDER_SITE_OTHER): Payer: BLUE CROSS/BLUE SHIELD

## 2016-06-14 DIAGNOSIS — R7989 Other specified abnormal findings of blood chemistry: Secondary | ICD-10-CM

## 2016-06-14 DIAGNOSIS — E876 Hypokalemia: Secondary | ICD-10-CM | POA: Diagnosis not present

## 2016-06-14 LAB — BASIC METABOLIC PANEL
BUN: 14 mg/dL (ref 6–23)
CO2: 30 mEq/L (ref 19–32)
Calcium: 8.3 mg/dL — ABNORMAL LOW (ref 8.4–10.5)
Chloride: 105 mEq/L (ref 96–112)
Creatinine, Ser: 0.84 mg/dL (ref 0.40–1.20)
GFR: 76.83 mL/min (ref >60.00)
Glucose, Bld: 97 mg/dL (ref 70–99)
Potassium: 3.9 mEq/L (ref 3.5–5.1)
Sodium: 136 mEq/L (ref 135–145)

## 2016-06-16 ENCOUNTER — Encounter: Payer: Self-pay | Admitting: General Practice

## 2016-07-06 ENCOUNTER — Other Ambulatory Visit (HOSPITAL_COMMUNITY): Payer: Self-pay | Admitting: Endocrinology

## 2016-07-06 DIAGNOSIS — C73 Malignant neoplasm of thyroid gland: Secondary | ICD-10-CM

## 2016-07-26 ENCOUNTER — Ambulatory Visit (HOSPITAL_COMMUNITY): Payer: BLUE CROSS/BLUE SHIELD

## 2016-07-26 ENCOUNTER — Encounter (HOSPITAL_COMMUNITY)
Admission: RE | Admit: 2016-07-26 | Discharge: 2016-07-26 | Disposition: A | Discharge status: Home / Self Care | Payer: BLUE CROSS/BLUE SHIELD | Source: Ambulatory Visit | Attending: Endocrinology | Admitting: Endocrinology

## 2016-07-26 DIAGNOSIS — C73 Malignant neoplasm of thyroid gland: Secondary | ICD-10-CM | POA: Diagnosis present

## 2016-07-26 MED ORDER — THYROTROPIN ALFA 1.1 MG IM SOLR
INTRAMUSCULAR | Status: AC
  Filled 2016-07-26: qty 0.9

## 2016-07-26 MED ORDER — THYROTROPIN ALFA 1.1 MG IM SOLR
0.9000 mg | INTRAMUSCULAR | Status: AC
  Administered 2016-07-26: 0.9 mg via INTRAMUSCULAR

## 2016-07-27 ENCOUNTER — Encounter (HOSPITAL_COMMUNITY)
Admission: RE | Admit: 2016-07-27 | Discharge: 2016-07-27 | Disposition: A | Discharge status: Home / Self Care | Payer: BLUE CROSS/BLUE SHIELD | Source: Ambulatory Visit | Attending: Endocrinology | Admitting: Endocrinology

## 2016-07-27 ENCOUNTER — Ambulatory Visit (HOSPITAL_COMMUNITY): Payer: BLUE CROSS/BLUE SHIELD

## 2016-07-27 DIAGNOSIS — C73 Malignant neoplasm of thyroid gland: Secondary | ICD-10-CM | POA: Diagnosis not present

## 2016-07-27 MED ORDER — THYROTROPIN ALFA 1.1 MG IM SOLR
0.9000 mg | INTRAMUSCULAR | Status: AC
  Administered 2016-07-27: 0.9 mg via INTRAMUSCULAR

## 2016-07-27 MED ORDER — THYROTROPIN ALFA 1.1 MG IM SOLR
INTRAMUSCULAR | Status: AC
  Filled 2016-07-27: qty 0.9

## 2016-07-28 ENCOUNTER — Ambulatory Visit (HOSPITAL_COMMUNITY)
Admission: RE | Admit: 2016-07-28 | Discharge: 2016-07-28 | Disposition: A | Discharge status: Home / Self Care | Payer: BLUE CROSS/BLUE SHIELD | Source: Ambulatory Visit | Attending: Endocrinology | Admitting: Endocrinology

## 2016-07-28 ENCOUNTER — Encounter (HOSPITAL_COMMUNITY): Payer: BLUE CROSS/BLUE SHIELD

## 2016-07-28 DIAGNOSIS — C73 Malignant neoplasm of thyroid gland: Secondary | ICD-10-CM | POA: Insufficient documentation

## 2016-07-28 LAB — HCG, SERUM, QUALITATIVE: Preg, Serum: NEGATIVE

## 2016-07-28 MED ORDER — SODIUM IODIDE I 131 CAPSULE
120.0000 | Freq: Once | INTRAVENOUS | Status: AC | PRN
  Administered 2016-07-28: 120 via ORAL

## 2016-08-04 ENCOUNTER — Encounter (HOSPITAL_COMMUNITY)
Admission: RE | Admit: 2016-08-04 | Discharge: 2016-08-04 | Disposition: A | Discharge status: Home / Self Care | Payer: BLUE CROSS/BLUE SHIELD | Source: Ambulatory Visit | Attending: Endocrinology | Admitting: Endocrinology

## 2016-08-04 ENCOUNTER — Encounter (HOSPITAL_COMMUNITY): Payer: BLUE CROSS/BLUE SHIELD

## 2016-08-04 DIAGNOSIS — C73 Malignant neoplasm of thyroid gland: Secondary | ICD-10-CM | POA: Diagnosis not present

## 2016-08-17 ENCOUNTER — Encounter (HOSPITAL_COMMUNITY): Payer: Self-pay

## 2016-10-06 ENCOUNTER — Encounter: Payer: Self-pay | Admitting: General Practice

## 2016-10-06 ENCOUNTER — Ambulatory Visit (INDEPENDENT_AMBULATORY_CARE_PROVIDER_SITE_OTHER): Payer: BLUE CROSS/BLUE SHIELD | Admitting: Family Medicine

## 2016-10-06 ENCOUNTER — Encounter: Payer: Self-pay | Admitting: Family Medicine

## 2016-10-06 VITALS — BP 121/86 | HR 79 | Temp 98.2°F | Resp 16 | Ht 67.0 in | Wt 179.1 lb

## 2016-10-06 DIAGNOSIS — Z0000 Encounter for general adult medical examination without abnormal findings: Secondary | ICD-10-CM | POA: Diagnosis not present

## 2016-10-06 DIAGNOSIS — Z23 Encounter for immunization: Secondary | ICD-10-CM

## 2016-10-06 DIAGNOSIS — R1011 Right upper quadrant pain: Secondary | ICD-10-CM

## 2016-10-06 LAB — CBC WITH DIFFERENTIAL/PLATELET
Basophils Absolute: 0 10*3/uL (ref 0.0–0.1)
Basophils Relative: 0.5 % (ref 0.0–3.0)
Eosinophils Absolute: 0.3 10*3/uL (ref 0.0–0.7)
Eosinophils Relative: 4.6 % (ref 0.0–5.0)
HCT: 42.3 % (ref 36.0–46.0)
Hemoglobin: 14.4 g/dL (ref 12.0–15.0)
Lymphocytes Relative: 22.4 % (ref 12.0–46.0)
Lymphs Abs: 1.3 10*3/uL (ref 0.7–4.0)
MCHC: 34.1 g/dL (ref 30.0–36.0)
MCV: 100.4 fl — ABNORMAL HIGH (ref 78.0–100.0)
Monocytes Absolute: 0.3 10*3/uL (ref 0.1–1.0)
Monocytes Relative: 6.1 % (ref 3.0–12.0)
Neutro Abs: 3.7 10*3/uL (ref 1.4–7.7)
Neutrophils Relative %: 66.4 % (ref 43.0–77.0)
Platelets: 223 10*3/uL (ref 150.0–400.0)
RBC: 4.21 Mil/uL (ref 3.87–5.11)
RDW: 12.4 % (ref 11.5–15.5)
WBC: 5.6 10*3/uL (ref 4.0–10.5)

## 2016-10-06 LAB — BASIC METABOLIC PANEL
BUN: 16 mg/dL (ref 6–23)
CO2: 30 mEq/L (ref 19–32)
Calcium: 8.8 mg/dL (ref 8.4–10.5)
Chloride: 103 mEq/L (ref 96–112)
Creatinine, Ser: 0.82 mg/dL (ref 0.40–1.20)
GFR: 78.89 mL/min (ref >60.00)
Glucose, Bld: 100 mg/dL — ABNORMAL HIGH (ref 70–99)
Potassium: 4.1 mEq/L (ref 3.5–5.1)
Sodium: 141 mEq/L (ref 135–145)

## 2016-10-06 LAB — HEPATIC FUNCTION PANEL
ALT: 24 U/L (ref 0–35)
AST: 18 U/L (ref 0–37)
Albumin: 4.1 g/dL (ref 3.5–5.2)
Alkaline Phosphatase: 62 U/L (ref 39–117)
Bilirubin, Direct: 0.1 mg/dL (ref 0.0–0.3)
Total Bilirubin: 0.4 mg/dL (ref 0.2–1.2)
Total Protein: 6.8 g/dL (ref 6.0–8.3)

## 2016-10-06 LAB — LIPID PANEL
Cholesterol: 223 mg/dL — ABNORMAL HIGH (ref 0–200)
HDL: 58.6 mg/dL (ref >39.00)
LDL Cholesterol: 137 mg/dL — ABNORMAL HIGH (ref 0–99)
NonHDL: 164.23
Total CHOL/HDL Ratio: 4
Triglycerides: 137 mg/dL (ref 0.0–149.0)
VLDL: 27.4 mg/dL (ref 0.0–40.0)

## 2016-10-06 LAB — TSH: TSH: 0.93 u[IU]/mL (ref 0.35–4.50)

## 2016-10-06 LAB — VITAMIN D 25 HYDROXY (VIT D DEFICIENCY, FRACTURES): VITD: 19.76 ng/mL — ABNORMAL LOW (ref 30.00–100.00)

## 2016-10-06 NOTE — Addendum Note (Signed)
Addended by: Davis Gourd on: 10/06/2016 10:57 AM   Modules accepted: Orders

## 2016-10-06 NOTE — Patient Instructions (Addendum)
Follow up in 1 year or as needed We'll notify you of your lab results and make any changes if needed We'll call you with your ultrasound appt to investigate this abdominal pain Please keep track of your symptoms- when they occur, how long they last, what makes them better or worse, etc Continue to work on healthy diet and regular exercise- you can do it! Call with any questions or concerns Happy Fall!!!

## 2016-10-06 NOTE — Progress Notes (Signed)
Pre visit review using our clinic review tool, if applicable. No additional management support is needed unless otherwise documented below in the visit note. 

## 2016-10-06 NOTE — Progress Notes (Signed)
   Subjective:    Patient ID: Janice Mendez, female    DOB: Jun 22, 1966, 50 y.o.   MRN: [id number]  HPI CPE- UTD on pap, mammo, flu shot.  No record of Tdap.     Review of Systems Patient reports no vision/ hearing changes, adenopathy,fever, weight change,  persistant/recurrent hoarseness , swallowing issues, chest pain, palpitations, edema, persistant/recurrent cough, hemoptysis, dyspnea (rest/exertional/paroxysmal nocturnal), gastrointestinal bleeding (melena, rectal bleeding), bowel changes, GU symptoms (dysuria, hematuria, incontinence), Gyn symptoms (abnormal  bleeding, pain),  syncope, focal weakness, memory loss, numbness & tingling, skin/hair/nail changes, abnormal bruising or bleeding, anxiety, or depression.   RUQ- intermittent, sxs started 5-6 months ago.  Pt has not correlated pain w/ eating.  Can't say whether it makes her nauseous.  Not sure if radiates to shoulder.    + GERD    Objective:   Physical Exam General Appearance:    Alert, cooperative, no distress, appears stated age  Head:    Normocephalic, without obvious abnormality, atraumatic  Eyes:    PERRL, conjunctiva/corneas clear, EOM's intact, fundi    benign, both eyes  Ears:    Normal TM's and external ear canals, both ears  Nose:   Nares normal, septum midline, mucosa normal, no drainage    or sinus tenderness  Throat:   Lips, mucosa, and tongue normal; teeth and gums normal  Neck:   Supple, symmetrical, trachea midline, no adenopathy;    Thyroid: small fluid collection at site of incision  Back:     Symmetric, no curvature, ROM normal, no CVA tenderness  Lungs:     Clear to auscultation bilaterally, respirations unlabored  Chest Wall:    No tenderness or deformity   Heart:    Regular rate and rhythm, S1 and S2 normal, no murmur, rub   or gallop  Breast Exam:    Deferred to GYN  Abdomen:     Soft, bowel sounds active all four quadrants, no masses, no organomegaly.  TTP over RUQ  Genitalia:    Deferred to GYN    Rectal:    Extremities:   Extremities normal, atraumatic, no cyanosis or edema  Pulses:   2+ and symmetric all extremities  Skin:   Skin color, texture, turgor normal, no rashes or lesions  Lymph nodes:   Cervical, supraclavicular, and axillary nodes normal  Neurologic:   CNII-XII intact, normal strength, sensation and reflexes    throughout          Assessment & Plan:  CPE- PE WNL w/ exception of RUQ TTP.  UTD on GYN.  Tdap given today.  Check labs.  Anticipatory guidance provided.   RUQ- new to provider, ongoing for pt.  She does not report association w/ eating, any N/V, or radiation to R shoulder.  Check labs and get [place] to assess.  Will follow closely

## 2016-10-07 ENCOUNTER — Other Ambulatory Visit: Payer: Self-pay | Admitting: Family Medicine

## 2016-10-07 MED ORDER — VITAMIN D 50 MCG (2000 UT) PO TABS: 2000.0000 [IU] | ORAL_TABLET | Freq: Every day | ORAL | 0 refills | Status: AC

## 2016-10-07 MED ORDER — VITAMIN D (ERGOCALCIFEROL) 1.25 MG (50000 UNIT) PO CAPS: ORAL_CAPSULE | ORAL | 0 refills | Status: DC

## 2016-10-14 ENCOUNTER — Ambulatory Visit (HOSPITAL_COMMUNITY)
Admission: RE | Admit: 2016-10-14 | Discharge: 2016-10-14 | Disposition: A | Discharge status: Home / Self Care | Payer: BLUE CROSS/BLUE SHIELD | Source: Ambulatory Visit | Attending: Family Medicine | Admitting: Family Medicine

## 2016-10-14 ENCOUNTER — Other Ambulatory Visit: Payer: Self-pay | Admitting: Family Medicine

## 2016-10-14 DIAGNOSIS — R1011 Right upper quadrant pain: Secondary | ICD-10-CM | POA: Diagnosis not present

## 2016-12-03 ENCOUNTER — Ambulatory Visit: Payer: BLUE CROSS/BLUE SHIELD | Admitting: Physician Assistant

## 2017-02-19 ENCOUNTER — Other Ambulatory Visit: Payer: Self-pay | Admitting: Women's Health

## 2017-02-19 DIAGNOSIS — N390 Urinary tract infection, site not specified: Secondary | ICD-10-CM

## 2017-04-27 ENCOUNTER — Encounter: Payer: Self-pay | Admitting: Gynecology

## 2017-10-05 ENCOUNTER — Other Ambulatory Visit (HOSPITAL_COMMUNITY): Payer: Self-pay | Admitting: Endocrinology

## 2017-10-05 DIAGNOSIS — C73 Malignant neoplasm of thyroid gland: Secondary | ICD-10-CM

## 2017-10-07 ENCOUNTER — Encounter: Payer: BLUE CROSS/BLUE SHIELD | Admitting: Family Medicine

## 2017-10-17 ENCOUNTER — Encounter (HOSPITAL_COMMUNITY)
Admission: RE | Admit: 2017-10-17 | Discharge: 2017-10-17 | Disposition: A | Discharge status: Home / Self Care | Payer: BLUE CROSS/BLUE SHIELD | Source: Ambulatory Visit | Attending: Endocrinology | Admitting: Endocrinology

## 2017-10-17 DIAGNOSIS — C73 Malignant neoplasm of thyroid gland: Secondary | ICD-10-CM | POA: Diagnosis not present

## 2017-10-17 MED ORDER — THYROTROPIN ALFA 1.1 MG IM SOLR
INTRAMUSCULAR | Status: AC
  Filled 2017-10-17: qty 0.9

## 2017-10-17 MED ORDER — THYROTROPIN ALFA 1.1 MG IM SOLR
0.9000 mg | INTRAMUSCULAR | Status: AC
  Administered 2017-10-17: 0.9 mg via INTRAMUSCULAR

## 2017-10-18 ENCOUNTER — Encounter (HOSPITAL_COMMUNITY)
Admission: RE | Admit: 2017-10-18 | Discharge: 2017-10-18 | Disposition: A | Discharge status: Home / Self Care | Payer: BLUE CROSS/BLUE SHIELD | Source: Ambulatory Visit | Attending: Endocrinology | Admitting: Endocrinology

## 2017-10-18 DIAGNOSIS — C73 Malignant neoplasm of thyroid gland: Secondary | ICD-10-CM | POA: Diagnosis not present

## 2017-10-18 MED ORDER — THYROTROPIN ALFA 1.1 MG IM SOLR
0.9000 mg | INTRAMUSCULAR | Status: AC
  Administered 2017-10-18: 0.9 mg via INTRAMUSCULAR

## 2017-10-19 ENCOUNTER — Encounter (HOSPITAL_COMMUNITY)
Admission: RE | Admit: 2017-10-19 | Discharge: 2017-10-19 | Disposition: A | Discharge status: Home / Self Care | Payer: BLUE CROSS/BLUE SHIELD | Source: Ambulatory Visit | Attending: Endocrinology | Admitting: Endocrinology

## 2017-10-19 DIAGNOSIS — C73 Malignant neoplasm of thyroid gland: Secondary | ICD-10-CM | POA: Diagnosis not present

## 2017-10-19 LAB — HCG, SERUM, QUALITATIVE: Preg, Serum: NEGATIVE

## 2017-10-19 MED ORDER — SODIUM IODIDE I 131 CAPSULE
4.0000 | Freq: Once | INTRAVENOUS | Status: AC | PRN
  Administered 2017-10-19: 4 via ORAL

## 2017-10-21 ENCOUNTER — Encounter (HOSPITAL_COMMUNITY)
Admission: RE | Admit: 2017-10-21 | Discharge: 2017-10-21 | Disposition: A | Discharge status: Home / Self Care | Payer: BLUE CROSS/BLUE SHIELD | Source: Ambulatory Visit | Attending: Endocrinology | Admitting: Endocrinology

## 2017-10-21 MED ORDER — SODIUM IODIDE I 131 CAPSULE: 4.0000 | Freq: Once | INTRAVENOUS | Status: DC | PRN

## 2017-10-27 ENCOUNTER — Other Ambulatory Visit: Payer: Self-pay | Admitting: Endocrinology

## 2017-10-27 DIAGNOSIS — C73 Malignant neoplasm of thyroid gland: Secondary | ICD-10-CM

## 2017-11-09 ENCOUNTER — Other Ambulatory Visit (HOSPITAL_COMMUNITY): Payer: Self-pay | Admitting: Endocrinology

## 2017-11-09 DIAGNOSIS — C73 Malignant neoplasm of thyroid gland: Secondary | ICD-10-CM

## 2017-11-23 ENCOUNTER — Ambulatory Visit (HOSPITAL_COMMUNITY)
Admission: RE | Admit: 2017-11-23 | Discharge: 2017-11-23 | Disposition: A | Discharge status: Home / Self Care | Payer: BLUE CROSS/BLUE SHIELD | Source: Ambulatory Visit | Attending: Endocrinology | Admitting: Endocrinology

## 2017-11-23 ENCOUNTER — Encounter (HOSPITAL_COMMUNITY): Payer: BLUE CROSS/BLUE SHIELD

## 2017-11-23 DIAGNOSIS — N200 Calculus of kidney: Secondary | ICD-10-CM | POA: Insufficient documentation

## 2017-11-23 DIAGNOSIS — I517 Cardiomegaly: Secondary | ICD-10-CM | POA: Insufficient documentation

## 2017-11-23 DIAGNOSIS — D259 Leiomyoma of uterus, unspecified: Secondary | ICD-10-CM | POA: Diagnosis not present

## 2017-11-23 DIAGNOSIS — I2510 Atherosclerotic heart disease of native coronary artery without angina pectoris: Secondary | ICD-10-CM | POA: Diagnosis not present

## 2017-11-23 DIAGNOSIS — D1771 Benign lipomatous neoplasm of kidney: Secondary | ICD-10-CM | POA: Insufficient documentation

## 2017-11-23 DIAGNOSIS — C73 Malignant neoplasm of thyroid gland: Secondary | ICD-10-CM | POA: Diagnosis not present

## 2017-11-23 LAB — GLUCOSE, CAPILLARY: Glucose-Capillary: 88 mg/dL (ref 65–99)

## 2017-11-23 MED ORDER — FLUDEOXYGLUCOSE F - 18 (FDG) INJECTION
8.5000 | Freq: Once | INTRAVENOUS | Status: AC | PRN
  Administered 2017-11-23: 8.5 via INTRAVENOUS

## 2018-01-12 IMAGING — US US THYROID BIOPSY
1 series · 13 of 22 positions shown · non-contrast
Comparison: Thyroid ultrasound -0 04/07/2026

MEDICATIONS:
None

COMPLICATIONS:
None immediate.

INDICATION: Indeterminate thyroid nodules

EXAM:
ULTRASOUND GUIDED THYROID FINE NEEDLE ASPIRATION x2
TECHNIQUE: Informed written consent was obtained from the patient after a
discussion of the risks, benefits and alternatives to treatment.
Questions regarding the procedure were encouraged and answered. A
timeout was performed prior to the initiation of the procedure.

[Series 1: us thyroid biopsy · 0.06mm/px · 22 acquisitions, 13 frames shown]
[im 1/22]
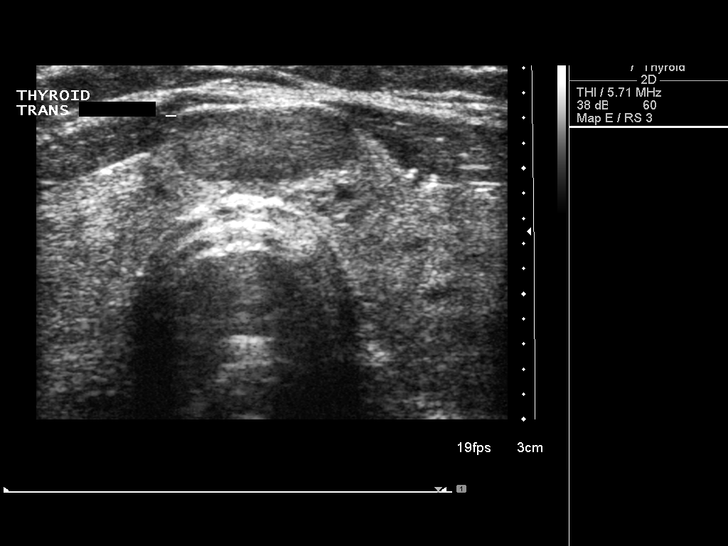
[im 3/22]
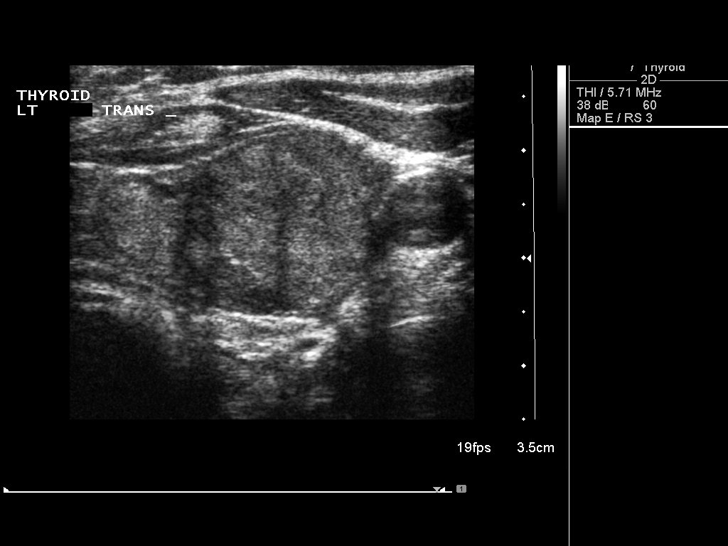
[im 5/22]
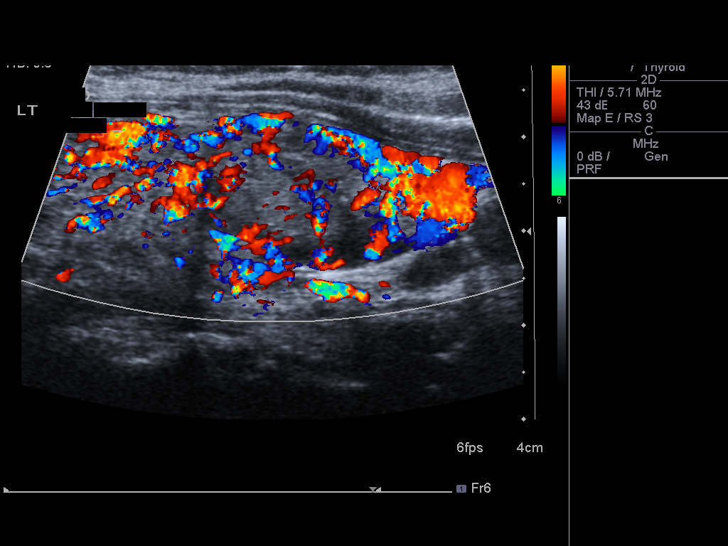
[im 6/22]
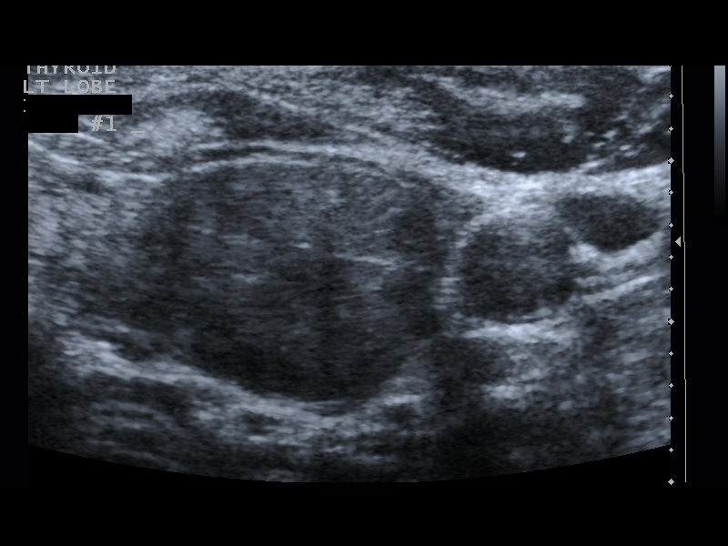
[im 8/22]
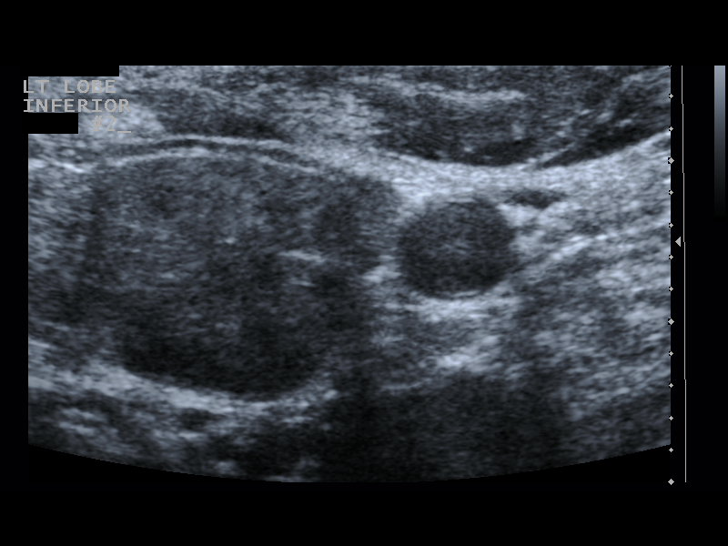
[im 10/22]
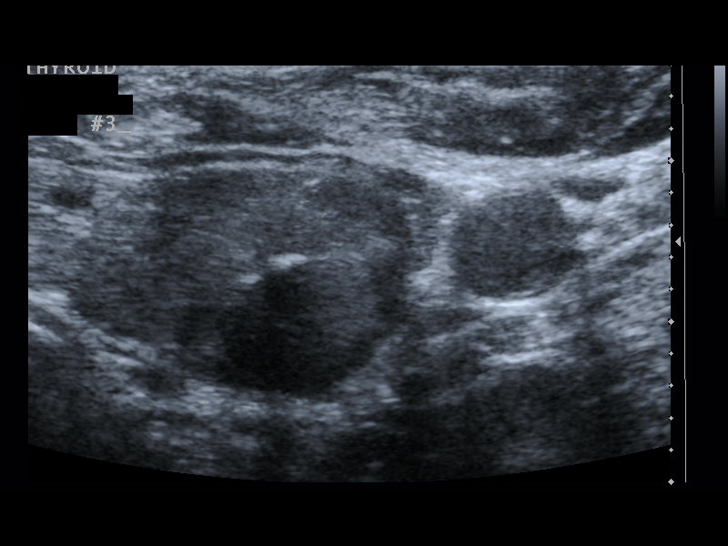
[im 12/22]
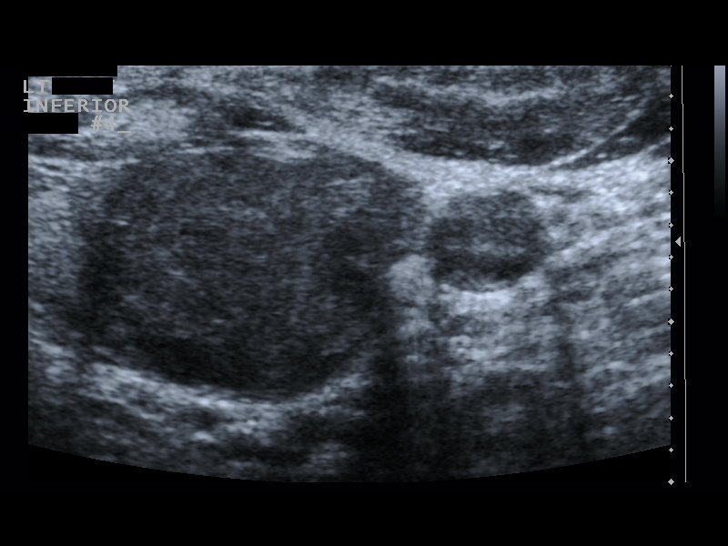
[im 13/22]
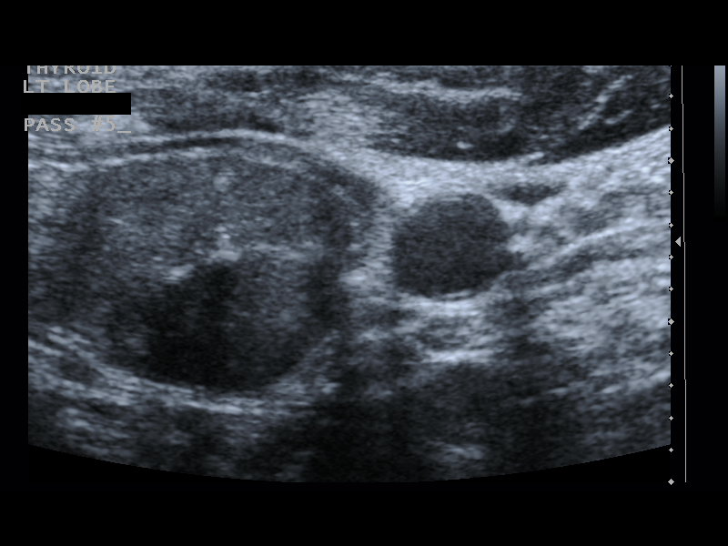
[im 15/22]
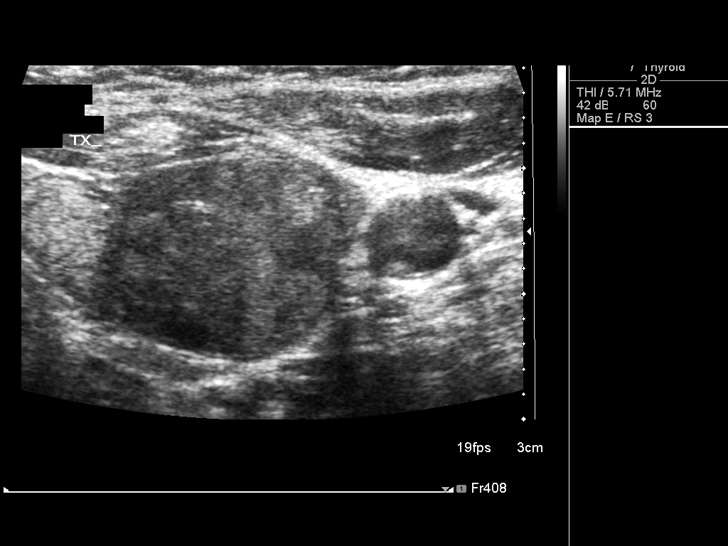
[im 17/22]
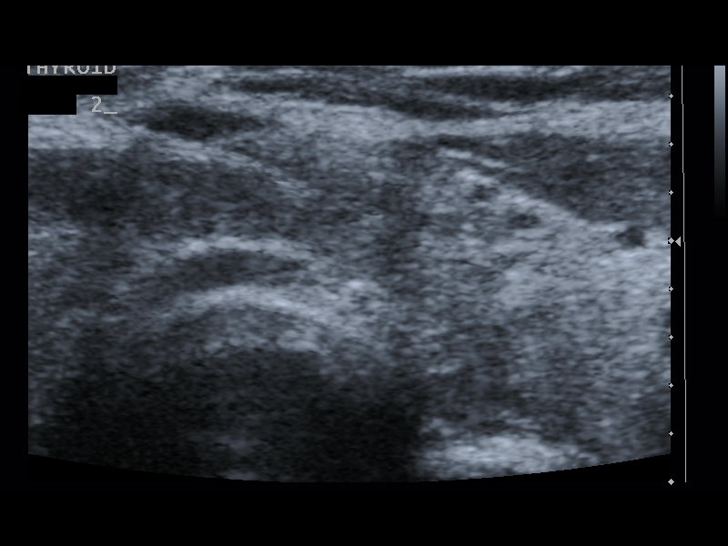
[im 18/22]
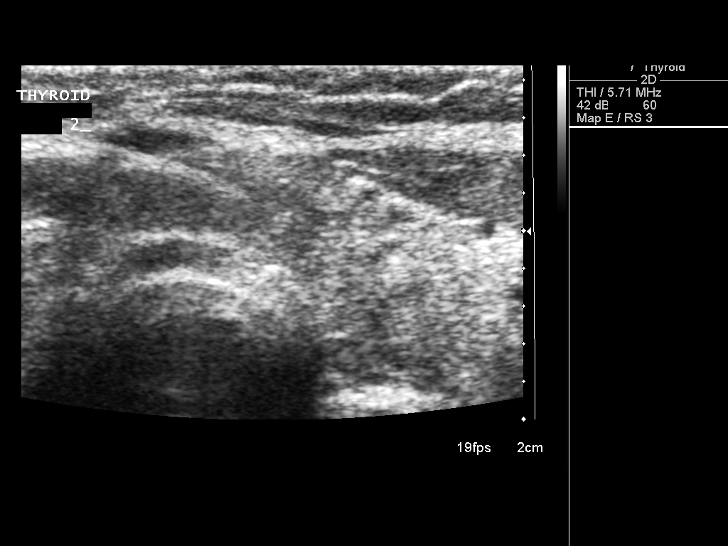
[im 20/22]
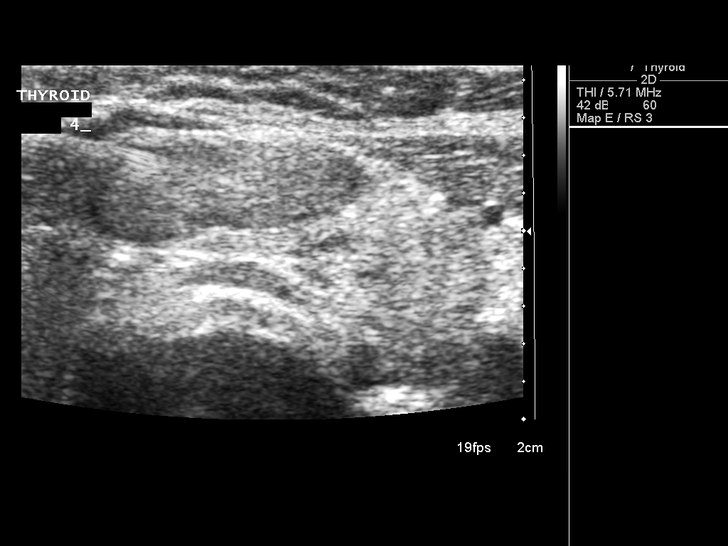
[im 22/22]
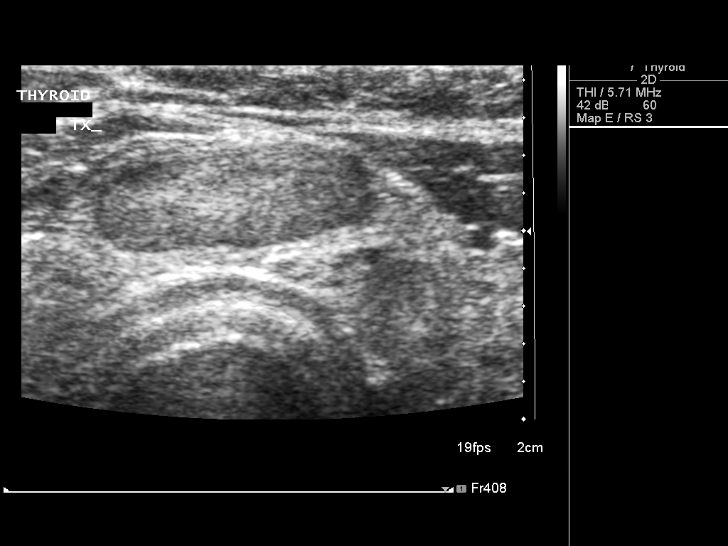

[13 of 22 positions shown; findings below may reference images not displayed]

Pre-procedural ultrasound scanning demonstrated unchanged size and
appearance of the dominant approximately 2.3 cm nodule within the
inferior aspect of the left lobe of the thyroid as well as the
approximately 1.9 cm nodule within the thyroid isthmus.

The procedures were planned. The neck was prepped in the usual
sterile fashion, and a sterile drape was applied covering the
operative field. A timeout was performed prior to the initiation of
the procedure. Local anesthesia was provided with 1% lidocaine.

Under direct ultrasound guidance, 5 FNA biopsies were performed of
the dominant approximately 2.3 cm nodule within inferior aspect the
left lobe of the thyroid with a 27 gauge needle. The samples were
prepared and submitted to pathology. Note, the final 2 acquired
samples were set aside for potential Afirma testing.

Under direct ultrasound guidance, 5 FNA biopsies were performed of
the dominant approximately 1.9 cm nodule within the thyroid isthmus
with a 27 gauge needle. The samples were prepared and submitted to
pathology. Note, the final 2 acquired samples were set aside for
potential Afirma testing.

Limited post procedural scanning was negative for hematoma or
additional complication. Dressings were placed. The patient
tolerated the above procedures procedure well without immediate
postprocedural complication.
IMPRESSION: 1. Technically successful ultrasound guided fine needle aspiration
of dominant approximately 2.3 cm nodule within the inferior aspect
of the left lobe of the thyroid.
2. Technically successful ultrasound-guided fine-needle aspiration
of dominant approximately 1.9 cm nodule within the thyroid isthmus.

## 2018-10-09 ENCOUNTER — Other Ambulatory Visit: Payer: Self-pay | Admitting: Family Medicine

## 2018-10-09 DIAGNOSIS — Z1231 Encounter for screening mammogram for malignant neoplasm of breast: Secondary | ICD-10-CM

## 2018-11-16 ENCOUNTER — Ambulatory Visit
Admission: RE | Admit: 2018-11-16 | Discharge: 2018-11-16 | Disposition: A | Discharge status: Home / Self Care | Payer: BLUE CROSS/BLUE SHIELD | Source: Ambulatory Visit | Attending: Family Medicine | Admitting: Family Medicine

## 2018-11-16 DIAGNOSIS — Z1231 Encounter for screening mammogram for malignant neoplasm of breast: Secondary | ICD-10-CM

## 2018-11-23 ENCOUNTER — Encounter: Payer: Self-pay | Admitting: Family Medicine

## 2018-11-23 ENCOUNTER — Other Ambulatory Visit: Payer: Self-pay

## 2018-11-23 ENCOUNTER — Ambulatory Visit: Payer: BLUE CROSS/BLUE SHIELD | Admitting: Family Medicine

## 2018-11-23 VITALS — BP 132/82 | HR 76 | Temp 98.9°F | Resp 16 | Ht 67.0 in | Wt 171.4 lb

## 2018-11-23 DIAGNOSIS — I10 Essential (primary) hypertension: Secondary | ICD-10-CM

## 2018-11-23 DIAGNOSIS — C73 Malignant neoplasm of thyroid gland: Secondary | ICD-10-CM | POA: Diagnosis not present

## 2018-11-23 MED ORDER — AMLODIPINE BESYLATE 5 MG PO TABS
5.0000 mg | ORAL_TABLET | Freq: Every day | ORAL | 1 refills | Status: DC
Start: 2018-11-23 — End: 2020-07-03

## 2018-11-23 NOTE — Patient Instructions (Signed)
Schedule your complete physical in 4-6 months IF you notice the blood pressure jumping around, let me know! If consistently higher than 140/90- let me know!! Ask them to check your pressure with the manual cuff Continue to take the Amlodipine daily Call with any questions or concerns HAPPY HOLIDAYS!!!  (and good luck!!)

## 2018-11-23 NOTE — Progress Notes (Signed)
   Subjective:    Patient ID: Janice Mendez, female    DOB: 03-Oct-1966, 52 y.o.   MRN: [id number]  HPI HTN- pt's BP was 171/112 on 11/13 at [place].  They started her on Amlodipine 5mg  daily.  Today's BP is 132/82.  Pt reports it was just that 1 episode of elevation.  No CP, SOB, HAs, visual changes, edema. + family hx of HTN.  Walking regularly.  Thyroid cancer- pt is s/p thyroidectomy but Dr Chalmers Cater noted that her antibodies were rising.  B/c of this, she was transferred to [place].  Pt has upcoming MRI and workup pending.  She is understandably anxious.  Review of Systems For ROS see HPI     Objective:   Physical Exam Vitals signs reviewed.  Constitutional:      General: She is not in acute distress.    Appearance: She is well-developed.  HENT:     Head: Normocephalic and atraumatic.  Eyes:     Conjunctiva/sclera: Conjunctivae normal.     Pupils: Pupils are equal, round, and reactive to light.  Neck:     Musculoskeletal: Normal range of motion and neck supple.     Thyroid: No thyromegaly.  Cardiovascular:     Rate and Rhythm: Normal rate and regular rhythm.     Heart sounds: Normal heart sounds. No murmur.  Pulmonary:     Effort: Pulmonary effort is normal. No respiratory distress.     Breath sounds: Normal breath sounds.  Abdominal:     General: There is no distension.     Palpations: Abdomen is soft.     Tenderness: There is no abdominal tenderness.  Lymphadenopathy:     Cervical: No cervical adenopathy.  Skin:    General: Skin is warm and dry.  Neurological:     Mental Status: She is alert and oriented to person, place, and time.  Psychiatric:        Behavior: Behavior normal.           Assessment & Plan:

## 2018-11-23 NOTE — Assessment & Plan Note (Addendum)
Pt may have a recurrence.  She is appropriately anxious about this.  Following at [facility].  Has MRI scheduled for next week.

## 2018-11-23 NOTE — Assessment & Plan Note (Signed)
New.  Pt was started on medication on 11/13 after a reading of 171/112.  Pt reports this was her initial consultation regarding the possible return of her thyroid cancer and they used a Dynamap rather than a manual cuff.  This was her only elevated reading.  At this time it is unclear whether she needs the medication but given her upcoming stressors (holidays, thyroid workup, etc) will continue medication at this time.  Reviewed lifestyle and dietary modifications that will improve BP.  Will follow closely.

## 2018-11-28 ENCOUNTER — Ambulatory Visit: Payer: BLUE CROSS/BLUE SHIELD | Admitting: Women's Health

## 2018-11-28 ENCOUNTER — Encounter: Payer: Self-pay | Admitting: Women's Health

## 2018-11-28 VITALS — BP 118/78 | Ht 67.0 in | Wt 172.0 lb

## 2018-11-28 DIAGNOSIS — Z01419 Encounter for gynecological examination (general) (routine) without abnormal findings: Secondary | ICD-10-CM | POA: Diagnosis not present

## 2018-11-28 DIAGNOSIS — Z13820 Encounter for screening for osteoporosis: Secondary | ICD-10-CM

## 2018-11-28 DIAGNOSIS — N390 Urinary tract infection, site not specified: Secondary | ICD-10-CM | POA: Diagnosis not present

## 2018-11-28 DIAGNOSIS — N921 Excessive and frequent menstruation with irregular cycle: Secondary | ICD-10-CM

## 2018-11-28 MED ORDER — MEGESTROL ACETATE 20 MG PO TABS: 20.0000 mg | ORAL_TABLET | Freq: Every day | ORAL | 0 refills | Status: DC

## 2018-11-28 MED ORDER — NITROFURANTOIN MACROCRYSTAL 50 MG PO CAPS: ORAL_CAPSULE | ORAL | 0 refills | Status: DC

## 2018-11-28 NOTE — Progress Notes (Signed)
Janice Mendez 03/21/1966 [id number]    History:    Presents for annual exam.  Amenorrheic greater than 1 year started cycle 3 days ago, menorrhagia used 1 box of tampons in 1 day.  Cycle now lighter.  BTL.  05/2016 thyroid cancer thyroidectomy had radiation in 2017 and 2018 and is follow-up scheduled tomorrow at [facility] has an increased thyroid antibody.  Uses Macrobid bid 50 mg with coitus no UTIs in the past year.  Normal Pap and mammogram history.  Recently started on hypertension medication per primary care.  Past medical history, past surgical history, family history and social history were all reviewed and documented in the EPIC chart.  Works at [employer] and does travel with job.  One daughter, married this year.  Mother hypertension, father murdered when she was small child.  ROS:  A ROS was performed and pertinent positives and negatives are included.  Exam:  Vitals:   11/28/18 1045  BP: 118/78  Weight: 172 lb (78 kg)  Height: 5\' 7"  (1.702 m)   Body mass index is 26.94 kg/m.   General appearance:  Normal Thyroid:  Symmetrical, normal in size, without palpable masses or nodularity. Respiratory  Auscultation:  Clear without wheezing or rhonchi Cardiovascular  Auscultation:  Regular rate, without rubs, murmurs or gallops  Edema/varicosities:  Not grossly evident Abdominal  Soft,nontender, without masses, guarding or rebound.  Liver/spleen:  No organomegaly noted  Hernia:  None appreciated  Skin  Inspection:  Grossly normal   Breasts: Examined lying and sitting.     Right: Without masses, retractions, discharge or axillary adenopathy.     Left: Without masses, retractions, discharge or axillary adenopathy. Gentitourinary   Inguinal/mons:  Normal without inguinal adenopathy  External genitalia:  Normal  BUS/Urethra/Skene's glands:  Normal  Vagina:  Normal  Cervix:  Normal  Uterus:  normal in size, shape and contour.  Midline and mobile  Adnexa/parametria:      Rt: Without masses or tenderness.   Lt: Without masses or tenderness.  Anus and perineum: Normal  Digital rectal exam: Normal sphincter tone without palpated masses or tenderness  Assessment/Plan:  52 y.o. WF G4, P1 for annual exam.  Amenorrheic greater than 1 year currently on cycle 05/2016 thyroid cancer/thyroidectomy/radiation 2017 and 2018 follow-up [facility]-primary care manages 3 of recurrent UTIs  Plan: [facility], reviewed possibly thyroid issue causing amenorrhea and current cycle.  If [facility] elevated ultrasound.  SBEs, continue annual screening mammogram, calcium rich foods, vitamin D 2000 IU daily.  DEXA, importance of weightbearing and balance type exercise.  Macrodantin 50 mg p.o. with coitus.  Prescription for Megace 20 mg twice daily if needed if heavy bleeding occurs again.  Pap with HR HPV typing.     [place], 11:26 AM 11/28/2018

## 2018-11-28 NOTE — Patient Instructions (Signed)
Colonoscopy  [phone]  Health Maintenance for Postmenopausal Women Menopause is a normal process in which your reproductive ability comes to an end. This process happens gradually over a span of months to years, usually between the ages of 41 and 8. Menopause is complete when you have missed 12 consecutive menstrual periods. It is important to talk with your health care provider about some of the most common conditions that affect postmenopausal women, such as heart disease, cancer, and bone loss (osteoporosis). Adopting a healthy lifestyle and getting preventive care can help to promote your health and wellness. Those actions can also lower your chances of developing some of these common conditions. What should I know about menopause? During menopause, you may experience a number of symptoms, such as:  Moderate-to-severe hot flashes.  Night sweats.  Decrease in sex drive.  Mood swings.  Headaches.  Tiredness.  Irritability.  Memory problems.  Insomnia.  Choosing to treat or not to treat menopausal changes is an individual decision that you make with your health care provider. What should I know about hormone replacement therapy and supplements? Hormone therapy products are effective for treating symptoms that are associated with menopause, such as hot flashes and night sweats. Hormone replacement carries certain risks, especially as you become older. If you are thinking about using estrogen or estrogen with progestin treatments, discuss the benefits and risks with your health care provider. What should I know about heart disease and stroke? Heart disease, heart attack, and stroke become more likely as you age. This may be due, in part, to the hormonal changes that your body experiences during menopause. These can affect how your body processes dietary fats, triglycerides, and cholesterol. Heart attack and stroke are both medical emergencies. There are many things that you can do to  help prevent heart disease and stroke:  Have your blood pressure checked at least every 1-2 years. High blood pressure causes heart disease and increases the risk of stroke.  If you are 25-73 years old, ask your health care provider if you should take aspirin to prevent a heart attack or a stroke.  Do not use any tobacco products, including cigarettes, chewing tobacco, or electronic cigarettes. If you need help quitting, ask your health care provider.  It is important to eat a healthy diet and maintain a healthy weight. ? Be sure to include plenty of vegetables, fruits, low-fat dairy products, and lean protein. ? Avoid eating foods that are high in solid fats, added sugars, or salt (sodium).  Get regular exercise. This is one of the most important things that you can do for your health. ? Try to exercise for at least 150 minutes each week. The type of exercise that you do should increase your heart rate and make you sweat. This is known as moderate-intensity exercise. ? Try to do strengthening exercises at least twice each week. Do these in addition to the moderate-intensity exercise.  Know your numbers.Ask your health care provider to check your cholesterol and your blood glucose. Continue to have your blood tested as directed by your health care provider.  What should I know about cancer screening? There are several types of cancer. Take the following steps to reduce your risk and to catch any cancer development as early as possible. Breast Cancer  Practice breast self-awareness. ? This means understanding how your breasts normally appear and feel. ? It also means doing regular breast self-exams. Let your health care provider know about any changes, no matter how small.  If you are 40 or older, have a clinician do a breast exam (clinical breast exam or CBE) every year. Depending on your age, family history, and medical history, it may be recommended that you also have a yearly breast  X-ray (mammogram).  If you have a family history of breast cancer, talk with your health care provider about genetic screening.  If you are at high risk for breast cancer, talk with your health care provider about having an MRI and a mammogram every year.  Breast cancer (BRCA) gene test is recommended for women who have family members with BRCA-related cancers. Results of the assessment will determine the need for genetic counseling and BRCA1 and for BRCA2 testing. BRCA-related cancers include these types: ? Breast. This occurs in males or females. ? Ovarian. ? Tubal. This may also be called fallopian tube cancer. ? Cancer of the abdominal or pelvic lining (peritoneal cancer). ? Prostate. ? Pancreatic.  Cervical, Uterine, and Ovarian Cancer Your health care provider may recommend that you be screened regularly for cancer of the pelvic organs. These include your ovaries, uterus, and vagina. This screening involves a pelvic exam, which includes checking for microscopic changes to the surface of your cervix (Pap test).  For women ages 21-65, health care providers may recommend a pelvic exam and a Pap test every three years. For women ages 30-65, they may recommend the Pap test and pelvic exam, combined with testing for human papilloma virus (HPV), every five years. Some types of HPV increase your risk of cervical cancer. Testing for HPV may also be done on women of any age who have unclear Pap test results.  Other health care providers may not recommend any screening for nonpregnant women who are considered low risk for pelvic cancer and have no symptoms. Ask your health care provider if a screening pelvic exam is right for you.  If you have had past treatment for cervical cancer or a condition that could lead to cancer, you need Pap tests and screening for cancer for at least 20 years after your treatment. If Pap tests have been discontinued for you, your risk factors (such as having a new sexual  partner) need to be reassessed to determine if you should start having screenings again. Some women have medical problems that increase the chance of getting cervical cancer. In these cases, your health care provider may recommend that you have screening and Pap tests more often.  If you have a family history of uterine cancer or ovarian cancer, talk with your health care provider about genetic screening.  If you have vaginal bleeding after reaching menopause, tell your health care provider.  There are currently no reliable tests available to screen for ovarian cancer.  Lung Cancer Lung cancer screening is recommended for adults 55-80 years old who are at high risk for lung cancer because of a history of smoking. A yearly low-dose CT scan of the lungs is recommended if you:  Currently smoke.  Have a history of at least 30 pack-years of smoking and you currently smoke or have quit within the past 15 years. A pack-year is smoking an average of one pack of cigarettes per day for one year.  Yearly screening should:  Continue until it has been 15 years since you quit.  Stop if you develop a health problem that would prevent you from having lung cancer treatment.  Colorectal Cancer  This type of cancer can be detected and can often be prevented.  Routine colorectal cancer   screening usually begins at age 50 and continues through age 75.  If you have risk factors for colon cancer, your health care provider may recommend that you be screened at an earlier age.  If you have a family history of colorectal cancer, talk with your health care provider about genetic screening.  Your health care provider may also recommend using home test kits to check for hidden blood in your stool.  A small camera at the end of a tube can be used to examine your colon directly (sigmoidoscopy or colonoscopy). This is done to check for the earliest forms of colorectal cancer.  Direct examination of the colon  should be repeated every 5-10 years until age 75. However, if early forms of precancerous polyps or small growths are found or if you have a family history or genetic risk for colorectal cancer, you may need to be screened more often.  Skin Cancer  Check your skin from head to toe regularly.  Monitor any moles. Be sure to tell your health care provider: ? About any new moles or changes in moles, especially if there is a change in a mole's shape or color. ? If you have a mole that is larger than the size of a pencil eraser.  If any of your family members has a history of skin cancer, especially at a Harshil Cavallaro age, talk with your health care provider about genetic screening.  Always use sunscreen. Apply sunscreen liberally and repeatedly throughout the day.  Whenever you are outside, protect yourself by wearing long sleeves, pants, a wide-brimmed hat, and sunglasses.  What should I know about osteoporosis? Osteoporosis is a condition in which bone destruction happens more quickly than new bone creation. After menopause, you may be at an increased risk for osteoporosis. To help prevent osteoporosis or the bone fractures that can happen because of osteoporosis, the following is recommended:  If you are 19-50 years old, get at least 1,000 mg of calcium and at least 600 mg of vitamin D per day.  If you are older than age 50 but younger than age 70, get at least 1,200 mg of calcium and at least 600 mg of vitamin D per day.  If you are older than age 70, get at least 1,200 mg of calcium and at least 800 mg of vitamin D per day.  Smoking and excessive alcohol intake increase the risk of osteoporosis. Eat foods that are rich in calcium and vitamin D, and do weight-bearing exercises several times each week as directed by your health care provider. What should I know about how menopause affects my mental health? Depression may occur at any age, but it is more common as you become older. Common symptoms of  depression include:  Low or sad mood.  Changes in sleep patterns.  Changes in appetite or eating patterns.  Feeling an overall lack of motivation or enjoyment of activities that you previously enjoyed.  Frequent crying spells.  Talk with your health care provider if you think that you are experiencing depression. What should I know about immunizations? It is important that you get and maintain your immunizations. These include:  Tetanus, diphtheria, and pertussis (Tdap) booster vaccine.  Influenza every year before the flu season begins.  Pneumonia vaccine.  Shingles vaccine.  Your health care provider may also recommend other immunizations. This information is not intended to replace advice given to you by your health care provider. Make sure you discuss any questions you have with your health care   provider. Document Released: 01/21/2006 Document Revised: 06/18/2016 Document Reviewed: 09/02/2015 Elsevier Interactive Patient Education  2018 [employer].

## 2018-11-28 NOTE — Addendum Note (Signed)
Addended by: Lorine Bears on: 11/28/2018 12:08 PM   Modules accepted: Orders

## 2018-11-29 ENCOUNTER — Other Ambulatory Visit: Payer: Self-pay | Admitting: Women's Health

## 2018-11-29 DIAGNOSIS — N950 Postmenopausal bleeding: Secondary | ICD-10-CM

## 2018-11-29 LAB — FOLLICLE STIMULATING HORMONE: FSH: 44 m[IU]/mL

## 2018-12-04 LAB — PAP, TP IMAGING W/ HPV RNA, RFLX HPV TYPE 16,18/45: HPV DNA High Risk: NOT DETECTED

## 2018-12-27 ENCOUNTER — Other Ambulatory Visit: Payer: Self-pay | Admitting: Obstetrics & Gynecology

## 2018-12-27 DIAGNOSIS — N950 Postmenopausal bleeding: Secondary | ICD-10-CM

## 2019-01-08 ENCOUNTER — Ambulatory Visit: Payer: BLUE CROSS/BLUE SHIELD | Admitting: Obstetrics & Gynecology

## 2019-01-08 ENCOUNTER — Ambulatory Visit (INDEPENDENT_AMBULATORY_CARE_PROVIDER_SITE_OTHER): Payer: BLUE CROSS/BLUE SHIELD

## 2019-01-08 ENCOUNTER — Encounter: Payer: Self-pay | Admitting: Obstetrics & Gynecology

## 2019-01-08 DIAGNOSIS — N950 Postmenopausal bleeding: Secondary | ICD-10-CM

## 2019-01-08 NOTE — Progress Notes (Signed)
    Maridel Pixler [birth year] [id number]        53 y.o.  [id number] S/P BT/S  RP: Postmenopausal bleeding for Sonohysterogram  HPI: H/O Total Thyroidectomy/LN removal in 05/2016 on Synthroid.  No pelvic pain.  Amenorrhea x >1 year with a heavy 10 day vaginal bleeding in December 2019.   OB History  Gravida Para Term Preterm AB Living  4       3 1   SAB TAB Ectopic Multiple Live Births  3            # Outcome Date GA Lbr Len/2nd Weight Sex Delivery Anes PTL Lv  4 SAB           3 SAB           2 SAB           1 Gravida             Past medical history,surgical history, problem list, medications, allergies, family history and social history were all reviewed and documented in the EPIC chart.   Directed ROS with pertinent positives and negatives documented in the history of present illness/assessment and plan.  Exam:  There were no vitals filed for this visit. General appearance:  Normal  Pelvic US today: T/V images.  Uterus anteverted heterogeneous with a single cortical cyst.  The uterus measures 8.15 x 5.8 x 4.41 cm.  The endometrial lining is thin and normal at 4.1 mm.  Right ovary not seen due to bowel gas in the right adnexa.  Left ovary normal.  No apparent mass in the right or left adnexa.  No free fluid in the posterior cul-de-sac.   Assessment/Plan:  53 y.o. [id number]   1. Postmenopausal bleeding Probably had a perimenopausal ovulation with a menstrual period.  Pelvic ultrasound reassuring with a thin normal endometrial lining at 4.1 mm.  No indication to proceed with a sonohysterogram.  Patient reassured.  Precautions discussed with patient, will call back for reevaluation if has any abnormal bleeding in the future.  Counseling on above issues and coordination of care more than 50% for 15 minutes.  Princess Bruins MD, 11:43 AM 01/08/2019

## 2019-01-14 ENCOUNTER — Encounter: Payer: Self-pay | Admitting: Obstetrics & Gynecology

## 2019-01-14 NOTE — Patient Instructions (Signed)
1. Postmenopausal bleeding Probably had a perimenopausal ovulation with a menstrual period.  Pelvic ultrasound reassuring with a thin normal endometrial lining at 4.1 mm.  No indication to proceed with a sonohysterogram.  Patient reassured.  Precautions discussed with patient, will call back for reevaluation if has any abnormal bleeding in the future.  [place], it was a pleasure meeting you today!

## 2019-04-03 ENCOUNTER — Ambulatory Visit: Payer: BLUE CROSS/BLUE SHIELD | Admitting: Family Medicine

## 2019-05-18 ENCOUNTER — Ambulatory Visit: Payer: BLUE CROSS/BLUE SHIELD | Admitting: Family Medicine

## 2019-06-25 ENCOUNTER — Other Ambulatory Visit: Payer: Self-pay | Admitting: Women's Health

## 2019-06-25 DIAGNOSIS — N390 Urinary tract infection, site not specified: Secondary | ICD-10-CM

## 2019-06-25 NOTE — Telephone Encounter (Signed)
Okay for refill?  

## 2019-10-31 ENCOUNTER — Ambulatory Visit: Payer: BLUE CROSS/BLUE SHIELD | Admitting: Family Medicine

## 2019-11-28 ENCOUNTER — Ambulatory Visit: Payer: BC Managed Care – PPO | Admitting: Family Medicine

## 2020-06-13 ENCOUNTER — Other Ambulatory Visit: Payer: Self-pay

## 2020-06-13 DIAGNOSIS — N390 Urinary tract infection, site not specified: Secondary | ICD-10-CM

## 2020-07-03 ENCOUNTER — Other Ambulatory Visit: Payer: Self-pay

## 2020-07-03 ENCOUNTER — Encounter: Payer: Self-pay | Admitting: Nurse Practitioner

## 2020-07-03 ENCOUNTER — Ambulatory Visit (INDEPENDENT_AMBULATORY_CARE_PROVIDER_SITE_OTHER): Payer: BC Managed Care – PPO | Admitting: Nurse Practitioner

## 2020-07-03 VITALS — BP 122/80 | Ht 65.5 in | Wt 182.0 lb

## 2020-07-03 DIAGNOSIS — Z13220 Encounter for screening for lipoid disorders: Secondary | ICD-10-CM | POA: Diagnosis not present

## 2020-07-03 DIAGNOSIS — Z01419 Encounter for gynecological examination (general) (routine) without abnormal findings: Secondary | ICD-10-CM

## 2020-07-03 DIAGNOSIS — E559 Vitamin D deficiency, unspecified: Secondary | ICD-10-CM

## 2020-07-03 DIAGNOSIS — N950 Postmenopausal bleeding: Secondary | ICD-10-CM | POA: Diagnosis not present

## 2020-07-03 LAB — CBC WITH DIFFERENTIAL/PLATELET
Absolute Monocytes: 348 cells/uL (ref 200–950)
Basophils Absolute: 57 cells/uL (ref 0–200)
Basophils Relative: 1 %
Eosinophils Absolute: 211 cells/uL (ref 15–500)
Eosinophils Relative: 3.7 %
HCT: 40 % (ref 35.0–45.0)
Hemoglobin: 13.8 g/dL (ref 11.7–15.5)
Lymphs Abs: 1465 cells/uL (ref 850–3900)
MCH: 34 pg — ABNORMAL HIGH (ref 27.0–33.0)
MCHC: 34.5 g/dL (ref 32.0–36.0)
MCV: 98.5 fL (ref 80.0–100.0)
MPV: 11.7 fL (ref 7.5–12.5)
Monocytes Relative: 6.1 %
Neutro Abs: 3620 cells/uL (ref 1500–7800)
Neutrophils Relative %: 63.5 %
Platelets: 244 10*3/uL (ref 140–400)
RBC: 4.06 10*6/uL (ref 3.80–5.10)
RDW: 12.7 % (ref 11.0–15.0)
Total Lymphocyte: 25.7 %
WBC: 5.7 10*3/uL (ref 3.8–10.8)

## 2020-07-03 LAB — LIPID PANEL
Cholesterol: 201 mg/dL — ABNORMAL HIGH (ref <200)
HDL: 51 mg/dL (ref >=50)
LDL Cholesterol (Calc): 124 mg/dL (calc) — ABNORMAL HIGH
Non-HDL Cholesterol (Calc): 150 mg/dL (calc) — ABNORMAL HIGH (ref <130)
Total CHOL/HDL Ratio: 3.9 (calc) (ref <5.0)
Triglycerides: 151 mg/dL — ABNORMAL HIGH (ref <150)

## 2020-07-03 LAB — COMPREHENSIVE METABOLIC PANEL
AG Ratio: 1.5 (calc) (ref 1.0–2.5)
ALT: 24 U/L (ref 6–29)
AST: 18 U/L (ref 10–35)
Albumin: 4 g/dL (ref 3.6–5.1)
Alkaline phosphatase (APISO): 71 U/L (ref 37–153)
BUN: 10 mg/dL (ref 7–25)
CO2: 26 mmol/L (ref 20–32)
Calcium: 8 mg/dL — ABNORMAL LOW (ref 8.6–10.4)
Chloride: 105 mmol/L (ref 98–110)
Creat: 0.92 mg/dL (ref 0.50–1.05)
Globulin: 2.6 g/dL (calc) (ref 1.9–3.7)
Glucose, Bld: 94 mg/dL (ref 65–99)
Potassium: 3.8 mmol/L (ref 3.5–5.3)
Sodium: 140 mmol/L (ref 135–146)
Total Bilirubin: 0.5 mg/dL (ref 0.2–1.2)
Total Protein: 6.6 g/dL (ref 6.1–8.1)

## 2020-07-03 LAB — VITAMIN D 25 HYDROXY (VIT D DEFICIENCY, FRACTURES): Vit D, 25-Hydroxy: 29 ng/mL — ABNORMAL LOW (ref 30–100)

## 2020-07-03 MED ORDER — MEGESTROL ACETATE 20 MG PO TABS: 20.0000 mg | ORAL_TABLET | Freq: Every day | ORAL | 0 refills | Status: DC

## 2020-07-03 NOTE — Patient Instructions (Addendum)
Schedule mammogram [place] [phone] [address] [place], [place] [address]   Schedule colonoscopy! [facility] [phone] [place], [place] [address]   Abnormal Uterine Bleeding Abnormal uterine bleeding means bleeding more than usual from your uterus. It can include:  Bleeding between periods.  Bleeding after sex.  Bleeding that is heavier than normal.  Periods that last longer than usual.  Bleeding after you have stopped having your period (menopause). There are many problems that may cause this. You should see a doctor for any kind of bleeding that is not normal. Treatment depends on the cause of the bleeding. Follow these instructions at home:  Watch your condition for any changes.  Do not use tampons, douche, or have sex, if your doctor tells you not to.  Change your pads often.  Get regular well-woman exams. Make sure they include a pelvic exam and cervical cancer screening.  Keep all follow-up visits as told by your doctor. This is important. Contact a doctor if:  The bleeding lasts more than one week.  You feel dizzy at times.  You feel like you are going to throw up (nauseous).  You throw up. Get help right away if:  You pass out.  You have to change pads every hour.  You have belly (abdominal) pain.  You have a fever.  You get sweaty.  You get weak.  You passing large blood clots from your vagina. Summary  Abnormal uterine bleeding means bleeding more than usual from your uterus.  There are many problems that may cause this. You should see a doctor for any kind of bleeding that is not normal.  Treatment depends on the cause of the bleeding. This information is not intended to replace advice given to you by your health care provider. Make sure you discuss any questions you have with your health care provider. Document Revised: 11/23/2016 Document Reviewed: 11/23/2016 Elsevier Patient  Education  2020 [place] Maintenance for Postmenopausal Women Menopause is a normal process in which your ability to get pregnant comes to an end. This process happens slowly over many months or years, usually between the ages of 59 and 18. Menopause is complete when you have missed your menstrual periods for 12 months. It is important to talk with your health care provider about some of the most common conditions that affect women after menopause (postmenopausal women). These include heart disease, cancer, and bone loss (osteoporosis). Adopting a healthy lifestyle and getting preventive care can help to promote your health and wellness. The actions you take can also lower your chances of developing some of these common conditions. What should I know about menopause? During menopause, you may get a number of symptoms, such as:  Hot flashes. These can be moderate or severe.  Night sweats.  Decrease in sex drive.  Mood swings.  Headaches.  Tiredness.  Irritability.  Memory problems.  Insomnia. Choosing to treat or not to treat these symptoms is a decision that you make with your health care provider. Do I need hormone replacement therapy?  Hormone replacement therapy is effective in treating symptoms that are caused by menopause, such as hot flashes and night sweats.  Hormone replacement carries certain risks, especially as you become older. If you are thinking about using estrogen or estrogen with progestin, discuss the benefits and risks with your health care provider. What is my risk for heart disease and stroke? The risk of heart disease, heart  attack, and stroke increases as you age. One of the causes may be a change in the body's hormones during menopause. This can affect how your body uses dietary fats, triglycerides, and cholesterol. Heart attack and stroke are medical emergencies. There are many things that you can do to help prevent heart disease and stroke. Watch  your blood pressure  High blood pressure causes heart disease and increases the risk of stroke. This is more likely to develop in people who have high blood pressure readings, are of African descent, or are overweight.  Have your blood pressure checked: ? Every 3-5 years if you are 2-62 years of age. ? Every year if you are 37 years old or older. Eat a healthy diet   Eat a diet that includes plenty of vegetables, fruits, low-fat dairy products, and lean protein.  Do not eat a lot of foods that are high in solid fats, added sugars, or sodium. Get regular exercise Get regular exercise. This is one of the most important things you can do for your health. Most adults should:  Try to exercise for at least 150 minutes each week. The exercise should increase your heart rate and make you sweat (moderate-intensity exercise).  Try to do strengthening exercises at least twice each week. Do these in addition to the moderate-intensity exercise.  Spend less time sitting. Even light physical activity can be beneficial. Other tips  Work with your health care provider to achieve or maintain a healthy weight.  Do not use any products that contain nicotine or tobacco, such as cigarettes, e-cigarettes, and chewing tobacco. If you need help quitting, ask your health care provider.  Know your numbers. Ask your health care provider to check your cholesterol and your blood sugar (glucose). Continue to have your blood tested as directed by your health care provider. Do I need screening for cancer? Depending on your health history and family history, you may need to have cancer screening at different stages of your life. This may include screening for:  Breast cancer.  Cervical cancer.  Lung cancer.  Colorectal cancer. What is my risk for osteoporosis? After menopause, you may be at increased risk for osteoporosis. Osteoporosis is a condition in which bone destruction happens more quickly than new bone  creation. To help prevent osteoporosis or the bone fractures that can happen because of osteoporosis, you may take the following actions:  If you are 56-78 years old, get at least 1,000 mg of calcium and at least 600 mg of vitamin D per day.  If you are older than age 45 but younger than age 51, get at least 1,200 mg of calcium and at least 600 mg of vitamin D per day.  If you are older than age 63, get at least 1,200 mg of calcium and at least 800 mg of vitamin D per day. Smoking and drinking excessive alcohol increase the risk of osteoporosis. Eat foods that are rich in calcium and vitamin D, and do weight-bearing exercises several times each week as directed by your health care provider. How does menopause affect my mental health? Depression may occur at any age, but it is more common as you become older. Common symptoms of depression include:  Low or sad mood.  Changes in sleep patterns.  Changes in appetite or eating patterns.  Feeling an overall lack of motivation or enjoyment of activities that you previously enjoyed.  Frequent crying spells. Talk with your health care provider if you think that you are experiencing  depression. General instructions See your health care provider for regular wellness exams and vaccines. This may include:  Scheduling regular health, dental, and eye exams.  Getting and maintaining your vaccines. These include: ? Influenza vaccine. Get this vaccine each year before the flu season begins. ? Pneumonia vaccine. ? Shingles vaccine. ? Tetanus, diphtheria, and pertussis (Tdap) booster vaccine. Your health care provider may also recommend other immunizations. Tell your health care provider if you have ever been abused or do not feel safe at home. Summary  Menopause is a normal process in which your ability to get pregnant comes to an end.  This condition causes hot flashes, night sweats, decreased interest in sex, mood swings, headaches, or lack of  sleep.  Treatment for this condition may include hormone replacement therapy.  Take actions to keep yourself healthy, including exercising regularly, eating a healthy diet, watching your weight, and checking your blood pressure and blood sugar levels.  Get screened for cancer and depression. Make sure that you are up to date with all your vaccines. This information is not intended to replace advice given to you by your health care provider. Make sure you discuss any questions you have with your health care provider. Document Revised: 11/22/2018 Document Reviewed: 11/22/2018 Elsevier Patient Education  2020 [employer].

## 2020-07-03 NOTE — Progress Notes (Addendum)
   Janice Mendez 01/03/1966 [id number]   History:  54 y.o. G presents for annual exam with complaints of bleeding for 3 weeks.  Bleeding started 06/13/2020 and was heavy with clots the first week and has lightened up since, still having spotting today.  Amenorrheic since 2018 after removal of thyroid and radiation due to cancer.  December 2019 she had a cycle, elevated FSH at that time and unremarkable ultrasound that showed endometrial lining 4.1 mm.  Normal Pap and mammogram history.  Is being seen by [facility] for [facility] management.  Gynecologic History Patient's last menstrual period was 12/25/2018.   Contraception: post menopausal status and tubal ligation Last Pap: 11/28/2018. Results were: normal Last mammogram: 11/16/2018. Results were: normal Last colonoscopy: never   Past medical history, past surgical history, family history and social history were all reviewed and documented in the EPIC chart.  ROS:  A ROS was performed and pertinent positives and negatives are included.  Exam:  Vitals:   07/03/20 0815  BP: 122/80  Weight: 182 lb (82.6 kg)  Height: 5' 5.5" (1.664 m)   Body mass index is 29.83 kg/m.  General appearance:  Normal Thyroid:  Symmetrical, normal in size, without palpable masses or nodularity. Respiratory  Auscultation:  Clear without wheezing or rhonchi Cardiovascular  Auscultation:  Regular rate, without rubs, murmurs or gallops  Edema/varicosities:  Not grossly evident Abdominal  Soft,nontender, without masses, guarding or rebound.  Liver/spleen:  No organomegaly noted  Hernia:  None appreciated  Skin  Inspection:  Grossly normal   Breasts: Examined lying and sitting.   Right: Without masses, retractions, discharge or axillary adenopathy.   Left: Without masses, retractions, discharge or axillary adenopathy. Gentitourinary   Inguinal/mons:  Normal without inguinal adenopathy  External genitalia:  Normal  BUS/Urethra/Skene's glands:   Normal  Vagina:  Normal, slight bleeding from menses  Cervix:  Normal  Uterus:  Anteverted, normal in size, shape and contour.  Midline and mobile  Adnexa/parametria:     Rt: Without masses or tenderness.   Lt: Without masses or tenderness.  Anus and perineum: Normal  Assessment/Plan:  54 y.o. G for annual exam with complaints of bleeding for 3 weeks.  Well female exam with routine gynecological exam - Plan: CBC with Differential/Platelet, Comprehensive metabolic panel. Education provided on SBEs, importance of preventative screenings, current guidelines, high calcium diet, regular exercise, and multivitamin daily.   Lipid screening - Plan: Lipid panel  Vitamin D deficiency - Plan: VITAMIN D 25 Hydroxy (Vit-D Deficiency, Fractures).   Postmenopausal bleeding - Plan: US PELVIS TRANSVAGINAL NON-OB (TV ONLY). If normal we will proceed with an endometrial biopsy. Megace 20 mg daily #20 as needed for heavy bleeding.   Screening for breast cancer - Discussed importance of preventative screenings and current guidelines.  Information provided on the breast center and she plans to schedule soon.  Screening for colon cancer -overdue.  Discussed importance of preventative screenings and current guidelines.  Information provided on [facility] and she says she will schedule soon.  Follow up in 1 year for annual      [place], 8:48 AM 07/03/2020

## 2020-07-10 ENCOUNTER — Encounter: Payer: BLUE CROSS/BLUE SHIELD | Admitting: Nurse Practitioner

## 2020-07-23 ENCOUNTER — Ambulatory Visit: Payer: BC Managed Care – PPO | Admitting: Nurse Practitioner

## 2020-07-23 ENCOUNTER — Other Ambulatory Visit: Payer: Self-pay

## 2020-07-23 ENCOUNTER — Encounter: Payer: Self-pay | Admitting: Nurse Practitioner

## 2020-07-23 ENCOUNTER — Ambulatory Visit (INDEPENDENT_AMBULATORY_CARE_PROVIDER_SITE_OTHER): Payer: BC Managed Care – PPO

## 2020-07-23 ENCOUNTER — Telehealth: Payer: Self-pay

## 2020-07-23 VITALS — BP 124/78

## 2020-07-23 DIAGNOSIS — N800 Endometriosis of uterus: Secondary | ICD-10-CM

## 2020-07-23 DIAGNOSIS — E559 Vitamin D deficiency, unspecified: Secondary | ICD-10-CM

## 2020-07-23 DIAGNOSIS — D219 Benign neoplasm of connective and other soft tissue, unspecified: Secondary | ICD-10-CM | POA: Diagnosis not present

## 2020-07-23 DIAGNOSIS — N951 Menopausal and female climacteric states: Secondary | ICD-10-CM

## 2020-07-23 DIAGNOSIS — N939 Abnormal uterine and vaginal bleeding, unspecified: Secondary | ICD-10-CM | POA: Diagnosis not present

## 2020-07-23 DIAGNOSIS — N950 Postmenopausal bleeding: Secondary | ICD-10-CM

## 2020-07-23 DIAGNOSIS — N854 Malposition of uterus: Secondary | ICD-10-CM | POA: Diagnosis not present

## 2020-07-23 MED ORDER — VITAMIN D (ERGOCALCIFEROL) 1.25 MG (50000 UNIT) PO CAPS
50000.0000 [IU] | ORAL_CAPSULE | ORAL | 0 refills | Status: AC
Start: 2020-07-23 — End: 2020-09-11

## 2020-07-23 NOTE — Progress Notes (Signed)
History: 54 year old presents to discuss ultrasound. She was seen by me 07/03/2020 with complaints of bleeding x 3 weeks. Bleeding started off heavy with clots for the first week and the lightened, still having occasional spotting mostly after intercourse. Cycles stopped in 2017 following diagnosis of thyroid cancer and treatment. She had bleeding episode December 2019, ultrasound then unremarkable with thin endometrium 4.32mm. [facility] 11/2018 44.0. Last visit her vitamin D was low at 29. She takes a daily Vitamin D supplement.   Exam: Appears well Ultrasound: Anteverted uterus -streaking and shadowing on anterior uterus, cystic spaces within the myometrium suggestive of adenomyosis.  Uterus bulky in size 9 x 6 x 5 cm.  Fibroids #3 with largest measuring 14 mm.  Symmetrical endometrium, no masses or thickening seen measuring 4.5 mm.  Both ovaries normal, right ovary difficult to visualize due to overlying bowel.  No adnexal masses, no free fluid.  Assessment: Abnormal uterine bleeding Uterine leiomyomas #3 Thin endometrium - 4.5 mmm Adenomyosis Vitamin D deficiency Perimenopause  Plan: Ultrasound reviewed and reassurance provided on findings.  Bleeding most likely caused by escape ovulation due to her current perimenopausal status.  Instructed to return if heavy, extended bleeding occurs. We will continue to monitor.  Vitamin D 50,000 units weekly for 8 weeks with repeat vitamin D level at that time. Megace as needed for heavy bleeding. She is agreeable to plan.

## 2020-07-23 NOTE — Telephone Encounter (Addendum)
Per staff message from [place], NP.  "Please let patient know I talked with Dr. Dellis Filbert and we have agreed to not move forward with an endometrial biopsy. She is most likely perimenopausal and had an escaped ovulation. If bleeding returns/continues we can consider medication management and routine ultrasounds. Thank you "  I spoke with patient and informed her.

## 2020-07-23 NOTE — Patient Instructions (Signed)
Vitamin D Deficiency Vitamin D deficiency is when your body does not have enough vitamin D. Vitamin D is important to your body because:  It helps your body use other minerals.  It helps to keep your bones strong and healthy.  It may help to prevent some diseases.  It helps your heart and other muscles work well. Not getting enough vitamin D can make your bones soft. It can also cause other health problems. What are the causes? This condition may be caused by:  Not eating enough foods that contain vitamin D.  Not getting enough sun.  Having diseases that make it hard for your body to absorb vitamin D.  Having a surgery in which a part of the stomach or a part of the small intestine is removed.  Having kidney disease or liver disease. What increases the risk? You are more likely to get this condition if:  You are older.  You do not spend much time outdoors.  You live in a nursing home.  You have had broken bones.  You have weak or thin bones (osteoporosis).  You have a disease or condition that changes how your body absorbs vitamin D.  You have dark skin.  You take certain medicines.  You are overweight or obese. What are the signs or symptoms?  In mild cases, there may not be any symptoms. If the condition is very bad, symptoms may include: ? Bone pain. ? Muscle pain. ? Falling often. ? Broken bones caused by a minor injury. How is this treated? Treatment may include taking supplements as told by your doctor. Your doctor will tell you what dose is best for you. Supplements may include:  Vitamin D.  Calcium. Follow these instructions at home: Eating and drinking   Eat foods that contain vitamin D, such as: ? Dairy products, cereals, or juices with added vitamin D. Check the label. ? Fish, such as salmon or trout. ? Eggs. ? Oysters. ? Mushrooms. The items listed above may not be a complete list of what you can eat and drink. Contact a dietitian for more  options. General instructions  Take medicines and supplements only as told by your doctor.  Get regular, safe exposure to natural sunlight.  Do not use a tanning bed.  Maintain a healthy weight. Lose weight if needed.  Keep all follow-up visits as told by your doctor. This is important. How is this prevented?  You can get vitamin D by: ? Eating foods that naturally contain vitamin D. ? Eating or drinking products that have vitamin D added to them, such as cereals, juices, and milk. ? Taking vitamin D or a multivitamin that contains vitamin D. ? Being in the sun. Your body makes vitamin D when your skin is exposed to sunlight. Your body changes the sunlight into a form of the vitamin that it can use. Contact a doctor if:  Your symptoms do not go away.  You feel sick to your stomach (nauseous).  You throw up (vomit).  You poop less often than normal, or you have trouble pooping (constipation). Summary  Vitamin D deficiency is when your body does not have enough vitamin D.  Vitamin D helps to keep your bones strong and healthy.  This condition is often treated by taking a supplement.  Your doctor will tell you what dose is best for you. This information is not intended to replace advice given to you by your health care provider. Make sure you discuss any questions you  have with your health care provider. Document Revised: 08/07/2018 Document Reviewed: 08/07/2018 Elsevier Patient Education  2020 [employer]  

## 2020-09-23 ENCOUNTER — Other Ambulatory Visit: Payer: BC Managed Care – PPO

## 2021-01-07 ENCOUNTER — Other Ambulatory Visit: Payer: Self-pay | Admitting: Family Medicine

## 2021-01-07 DIAGNOSIS — Z1231 Encounter for screening mammogram for malignant neoplasm of breast: Secondary | ICD-10-CM

## 2021-02-19 ENCOUNTER — Other Ambulatory Visit: Payer: Self-pay

## 2021-02-19 ENCOUNTER — Ambulatory Visit
Admission: RE | Admit: 2021-02-19 | Discharge: 2021-02-19 | Disposition: A | Discharge status: Home / Self Care | Payer: BC Managed Care – PPO | Source: Ambulatory Visit | Attending: Family Medicine | Admitting: Family Medicine

## 2021-02-19 DIAGNOSIS — Z1231 Encounter for screening mammogram for malignant neoplasm of breast: Secondary | ICD-10-CM

## 2021-06-10 ENCOUNTER — Encounter: Payer: Self-pay | Admitting: *Deleted

## 2022-03-11 ENCOUNTER — Telehealth: Payer: Self-pay | Admitting: *Deleted

## 2022-03-11 DIAGNOSIS — T884XXA Failed or difficult intubation, initial encounter: Secondary | ICD-10-CM | POA: Insufficient documentation

## 2022-03-11 NOTE — Telephone Encounter (Signed)
Dr Lorenso Courier, this pt has a documented difficult intubation as per note below in 2017 ? ? Do you want an OV or direct at [facility]  ?Please advise- thx Lelan Pons PV  ? ?Procedure Name: Intubation ?Date/Time: 06/03/2016 9:47 AM ?Performed by: Harle Stanford R ?Pre-anesthesia Checklist: Patient identified, Emergency Drugs available, Suction available, Patient being monitored and Timeout performed ?Patient Re-evaluated:Patient Re-evaluated prior to inductionOxygen Delivery Method: Circle system utilized ?Preoxygenation: Pre-oxygenation with 100% oxygen ?Intubation Type: IV induction ?Ventilation: Mask ventilation without difficulty ?Laryngoscope Size: Mac and 3 ?Grade View: Grade III ?Tube type: Oral ?Number of attempts: 3 ?Airway Equipment and Method: Bougie stylet ?Placement Confirmation: ETT inserted through vocal cords under direct vision,  positive ETCO2 and breath sounds checked- equal and bilateral ?Secured at: 21 cm ?Tube secured with: Tape ?Dental Injury: Teeth and Oropharynx as per pre-operative assessment  ?Difficulty Due To: Difficulty was unanticipated, 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 ?  ?

## 2022-03-11 NOTE — Telephone Encounter (Signed)
Pt needs to schedule an OV per Dr Lorenso Courier  ?Called pt- LM to return call to make an OV  ?[facility] colon and PV have been canceled  ?

## 2022-03-11 NOTE — Telephone Encounter (Signed)
OV 4-20 at 210 pm  ?

## 2022-04-01 ENCOUNTER — Encounter: Payer: Self-pay | Admitting: Internal Medicine

## 2022-04-01 ENCOUNTER — Ambulatory Visit: Payer: BC Managed Care – PPO | Admitting: Internal Medicine

## 2022-04-01 VITALS — BP 122/78 | HR 80 | Ht 67.0 in | Wt 184.6 lb

## 2022-04-01 DIAGNOSIS — Z1211 Encounter for screening for malignant neoplasm of colon: Secondary | ICD-10-CM

## 2022-04-01 NOTE — Progress Notes (Signed)
? ?Chief Complaint: Colon cancer screening ? ?HPI : 56 year old female with history of thyroid cancer s/p thyroidectomy c/b hypothyroidism, fibroids, difficult intubation presents to discuss colon cancer screening. ? ?Denies melena, hematochezia, constipation, weight loss, N&V, dysphagia. Will occasionally have loose stools. Denies fam hx of colon cancer. 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 She has never had a colonoscopy in the past.  ? ?Past Medical History:  ?Diagnosis Date  ? Difficult airway for intubation 2017  ? Headache   ? PONV (postoperative nausea and vomiting)   ? ? ?Past Surgical History:  ?Procedure Laterality Date  ? DILATION AND CURETTAGE OF UTERUS    ? NASAL SEPTUM SURGERY    ? THYROIDECTOMY N/A 06/03/2016  ? Procedure: TOTAL THYROIDECTOMY WITH LYMPH NODE REMOVAL;  Surgeon: Armandina Gemma, MD;  Location: [facility];  Service: General;  Laterality: N/A;  ? TUBAL LIGATION    ? ?Family History  ?Problem Relation Age of Onset  ? Hypertension Mother   ? Cancer Mother   ?     cervical  ? Cancer Paternal Grandfather   ?     lung  ? Heart attack Paternal Grandfather   ? ?Social History  ? ?Tobacco Use  ? Smoking status: Former  ?  Packs/day: 0.50  ?  Years: 10.00  ?  Pack years: 5.00  ?  Types: Cigarettes  ?  Quit date: 05/13/2016  ?  Years since quitting: 5.8  ? Smokeless tobacco: Never  ?Substance Use Topics  ? Alcohol use: Yes  ?  Alcohol/week: 0.0 standard drinks  ?  Comment: social  ? Drug use: No  ? ?Current Outpatient Medications  ?Medication Sig Dispense Refill  ? Aspirin-Acetaminophen-Caffeine 520-260-32.5 MG PACK Take 1 packet by mouth daily.    ? Cholecalciferol (VITAMIN D) 2000 units tablet Take 1 tablet (2,000 Units total) by mouth daily. 30 tablet 0  ? levothyroxine (SYNTHROID, LEVOTHROID) 125 MCG tablet Take 100 mcg by mouth daily before breakfast.     ? loratadine (CLARITIN) 10 MG tablet Take 10 mg by mouth daily.    ?  megestrol (MEGACE) 20 MG tablet Take 1 tablet (20 mg total) by mouth daily. 20 tablet 0  ? nitrofurantoin (MACRODANTIN) 50 MG capsule TAKE AS NEEDED WITH COITUS 30 capsule 0  ? ?No current facility-administered medications for this visit.  ? ?Allergies  ?Allergen Reactions  ? Penicillins Hives and Other (See Comments)  ?  Has patient had a PCN reaction causing immediate rash, facial/tongue/throat swelling, SOB or lightheadedness with hypotension: no ?Has patient had a PCN reaction causing severe rash involving mucus membranes or skin necrosis: no ?Has patient had a PCN reaction that required hospitalization no ?Has patient had a PCN reaction occurring within the last 10 years: no ?If all of the above answers are "NO", then may proceed with Cephalosporin use. ?  ? Hydrocodone Nausea And Vomiting  ? ? ? ?Review of Systems: ?All systems reviewed and negative except where noted in HPI.  ? ?Physical Exam: ?BP 122/78   Pulse 80   Ht '5\' 7"'$  (1.702 m)   Wt 184 lb 9.6 oz (83.7 kg)   LMP 12/25/2018   BMI 28.91 kg/m?  ?Constitutional: Pleasant,well-developed, female in no acute distress. ?HEENT: Normocephalic and atraumatic. Conjunctivae are normal. No scleral icterus. ?Cardiovascular: Normal rate, regular rhythm.  ?Pulmonary/chest: Effort normal and breath sounds normal. No wheezing, rales or rhonchi. ?Abdominal: Soft, nondistended, nontender.  Bowel sounds active throughout. There are no masses palpable. No hepatomegaly. ?Extremities: No edema ?Neurological: Alert and oriented to person place and time. ?Skin: Skin is warm and dry. No rashes noted. ?Psychiatric: Normal mood and affect. Behavior is normal. ? ?Labs 06/2020: CBC and CMP unremarkable ? ?Labs 12/2021: TSH nml, FT4 mildly elevated at 1.24 ? ?Abd U/S Complete 10/14/16: ?IMPRESSION: ?No gallstones or sonographic evidence of acute cholecystitis. If ?there are clinical concerns of chronic cholecystitis, a nuclear ?medicine hepatobiliary scan with gallbladder ejection  fraction ?determination may be useful. ?No acute abnormality observed within the abdomen ? ?ASSESSMENT AND PLAN: ?Colon cancer screening ?Patient presents to discuss colon cancer screening. I offered the patient the option of either a stool test such as FIT or Cologuard versus colonoscopy procedure. I went over each test with the patient, including the fact that since the patient was labelled as a difficult airway, her colonoscopy procedures would have to be done at the hospital. Patient does not have any contraindications to getting stool testing. Patient was interested in pursuing Cologuard test.  ?- Cologuard ? ?Christia Reading, MD ? ?

## 2022-04-01 NOTE — Patient Instructions (Signed)
If you are age 54 or older, your body mass index should be between 23-30. Your Body mass index is 28.91 kg/m?Marland Kitchen If this is out of the aforementioned range listed, please consider follow up with your Primary Care Provider. ? ?If you are age 43 or younger, your body mass index should be between 19-25. Your Body mass index is 28.91 kg/m?Marland Kitchen If this is out of the aformentioned range listed, please consider follow up with your Primary Care Provider.  ? ?Your provider has ordered Cologuard testing as an option for colon cancer screening. This is performed by [employer] and may be out of network with your insurance. PRIOR to completing the test, it is YOUR responsibility to contact your insurance about covered benefits for this test. Your out of pocket expense could be anywhere from $0.00 to $649.00.  ? ?When you call to check coverage with your insurer, please provide the following information:  ? ?-The ONLY provider of Cologuard is [place]  ?- CPT code for Cologuard is [phone].  ?-[employer] NPI # [id number]  ?-[employer] Tax ID # [id number]  ? ?We have already sent your demographic and insurance information to [employer] (phone number [phone]) and they should contact you within the next week regarding your test. If you have not heard from them within the next week, please call our office at [phone]. ?  ? ?________________________________________________________ ? ?The [facility] providers would like to encourage you to use [facility] to communicate with providers for non-urgent requests or questions.  Due to long hold times on the telephone, sending your provider a message by [facility] may be a faster and more efficient way to get a response.  Please allow 48 business hours for a response.  Please remember that this is for non-urgent requests.  ?_______________________________________________________ ? ?It was a pleasure to see you today! ? ?Thank you for  trusting me with your gastrointestinal care!   ? ? ?

## 2022-04-15 ENCOUNTER — Encounter: Payer: BC Managed Care – PPO | Admitting: Internal Medicine

## 2022-08-27 ENCOUNTER — Other Ambulatory Visit: Payer: Self-pay | Admitting: Family Medicine

## 2022-08-27 DIAGNOSIS — Z1231 Encounter for screening mammogram for malignant neoplasm of breast: Secondary | ICD-10-CM

## 2022-09-02 LAB — COLOGUARD: COLOGUARD: NEGATIVE

## 2022-09-21 ENCOUNTER — Ambulatory Visit: Payer: BC Managed Care – PPO

## 2022-09-22 ENCOUNTER — Ambulatory Visit (INDEPENDENT_AMBULATORY_CARE_PROVIDER_SITE_OTHER): Payer: BC Managed Care – PPO | Admitting: Nurse Practitioner

## 2022-09-22 ENCOUNTER — Encounter: Payer: Self-pay | Admitting: Nurse Practitioner

## 2022-09-22 VITALS — BP 124/84 | HR 54 | Ht 65.0 in | Wt 181.0 lb

## 2022-09-22 DIAGNOSIS — L659 Nonscarring hair loss, unspecified: Secondary | ICD-10-CM

## 2022-09-22 DIAGNOSIS — Z01419 Encounter for gynecological examination (general) (routine) without abnormal findings: Secondary | ICD-10-CM

## 2022-09-22 DIAGNOSIS — N912 Amenorrhea, unspecified: Secondary | ICD-10-CM

## 2022-09-22 DIAGNOSIS — E785 Hyperlipidemia, unspecified: Secondary | ICD-10-CM

## 2022-09-22 DIAGNOSIS — E559 Vitamin D deficiency, unspecified: Secondary | ICD-10-CM | POA: Diagnosis not present

## 2022-09-22 NOTE — Progress Notes (Signed)
Janice Mendez December 18, 1965 [id number]   History:  56 y.o. G presents for annual exam. 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 Bleeding returned in 2021 and occurs every 8-9 months. Ultrasound 07/2020 showed a few small fibroids, thin lining. Reports she still has episodes of bleeding but most recent was 9 months ago. She reports hair loss and feeling "off". H/O vitamin D deficiency. Normal Pap history. Is being [facility] by [facility] for thyroid management.  Gynecologic History Patient's last menstrual period was 12/2020 (approximate)   Contraception: tubal ligation Sexually active: Yes  Health maintenance Last Pap: 11/28/2018. Results were: Normal neg HPV Last mammogram: 02/19/2021. Results were: Normal. Scheduled next 10/26/22 Last colonoscopy: Never. Negative Cologuard 08/2022 Last Dexa: Not indicated  Past medical history, past surgical history, family history and social history were all reviewed and documented in the EPIC chart. Married. Works in Museum/gallery curator. Daughter, has 72 yo daughter.   ROS:  A ROS was performed and pertinent positives and negatives are included.  Exam:  Vitals:   09/22/22 1005  BP: 124/84  Pulse: (!) 54  SpO2: 94%  Weight: 181 lb (82.1 kg)  Height: '5\' 5"'$  (1.651 m)    Body mass index is 30.12 kg/m.  General appearance:  Normal Thyroid:  Symmetrical, normal in size, without palpable masses or nodularity. Respiratory  Auscultation:  Clear without wheezing or rhonchi Cardiovascular  Auscultation:  Regular rate, without rubs, murmurs or gallops  Edema/varicosities:  Not grossly evident Abdominal  Soft,nontender, without masses, guarding or rebound.  Liver/spleen:  No organomegaly noted  Hernia:  None appreciated  Skin  Inspection:  Grossly normal   Breasts: Examined lying and sitting.   Right: Without masses, retractions, discharge or axillary adenopathy.   Left: Without masses, retractions, discharge or  axillary adenopathy. Genitourinary   Inguinal/mons:  Normal without inguinal adenopathy  External genitalia:  Normal appearing vulva with no masses, tenderness, or lesions  BUS/Urethra/Skene's glands:  Normal  Vagina:  Normal appearing with normal color and discharge, no lesions  Cervix:  Normal appearing without discharge or lesions  Uterus:  Normal in size, shape and contour.  Midline and mobile, nontender  Adnexa/parametria:     Rt: Normal in size, without masses or tenderness.   Lt: Normal in size, without masses or tenderness.  Anus and perineum: Normal  Digital rectal exam: Normal sphincter tone without palpated masses or tenderness  Patient informed chaperone available to be present for breast and pelvic exam. Patient has requested no chaperone to be present. Patient has been advised what will be completed during breast and pelvic exam.   Assessment/Plan:  56 y.o. G for annual exam.   Well female exam with routine gynecological exam - Plan: CBC with Differential/Platelet, Comprehensive metabolic panel. Education provided on SBEs, importance of preventative screenings, current guidelines, high calcium diet, regular exercise, and multivitamin daily.   Hair loss - Plan: CBC with Differential/Platelet, Comprehensive metabolic panel, VITAMIN D 25 Hydroxy (Vit-D Deficiency, Fractures), Vitamin B12, Thyroid Panel With TSH. Has noticed increased hair loss and fatigue. Plans to discuss with endocrinologist as well.   Vitamin D deficiency - Plan: VITAMIN D 25 Hydroxy (Vit-D Deficiency, Fractures)  Hyperlipidemia, unspecified hyperlipidemia type - Plan: Lipid panel  Amenorrhea - Plan: Follicle stimulating hormone, Estradiol. 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 Bleeding returned in 2021 and occurs every 8-9 months. Ultrasound 07/2020 showed a few small fibroids, thin lining. Reports she still has  episodes of bleeding but most recent was 9 months ago.  Will check hormones. If bleeding occurs again she will let us know.   Screening for cervical cancer - Normal Pap history.  Will repeat at 5-year interval per guidelines.  Screening for breast cancer - Normal mammogram history. Continue annual screenings. Had scheduled yesterday but imaging center rescheduled her to November. Normal breast exam today.   Screening for colon cancer - Negative Cologuard 08/2022.  Screening for osteoporosis - Average risk. Will plan DXA at age 33.   Follow up in 1 year for annual.     Tamela Gammon [facility], 10:37 AM 09/22/2022

## 2022-09-23 LAB — CBC WITH DIFFERENTIAL/PLATELET
Absolute Monocytes: 325 cells/uL (ref 200–950)
Basophils Absolute: 62 cells/uL (ref 0–200)
Basophils Relative: 1.1 %
Eosinophils Absolute: 482 cells/uL (ref 15–500)
Eosinophils Relative: 8.6 %
HCT: 39.5 % (ref 35.0–45.0)
Hemoglobin: 13.9 g/dL (ref 11.7–15.5)
Lymphs Abs: 1260 cells/uL (ref 850–3900)
MCH: 33.7 pg — ABNORMAL HIGH (ref 27.0–33.0)
MCHC: 35.2 g/dL (ref 32.0–36.0)
MCV: 95.9 fL (ref 80.0–100.0)
MPV: 11.8 fL (ref 7.5–12.5)
Monocytes Relative: 5.8 %
Neutro Abs: 3472 cells/uL (ref 1500–7800)
Neutrophils Relative %: 62 %
Platelets: 226 10*3/uL (ref 140–400)
RBC: 4.12 10*6/uL (ref 3.80–5.10)
RDW: 12.2 % (ref 11.0–15.0)
Total Lymphocyte: 22.5 %
WBC: 5.6 10*3/uL (ref 3.8–10.8)

## 2022-09-23 LAB — COMPREHENSIVE METABOLIC PANEL
AG Ratio: 1.6 (calc) (ref 1.0–2.5)
ALT: 17 U/L (ref 6–29)
AST: 16 U/L (ref 10–35)
Albumin: 4.2 g/dL (ref 3.6–5.1)
Alkaline phosphatase (APISO): 78 U/L (ref 37–153)
BUN: 13 mg/dL (ref 7–25)
CO2: 30 mmol/L (ref 20–32)
Calcium: 8.4 mg/dL — ABNORMAL LOW (ref 8.6–10.4)
Chloride: 103 mmol/L (ref 98–110)
Creat: 0.79 mg/dL (ref 0.50–1.03)
Globulin: 2.6 g/dL (calc) (ref 1.9–3.7)
Glucose, Bld: 92 mg/dL (ref 65–99)
Potassium: 4.3 mmol/L (ref 3.5–5.3)
Sodium: 141 mmol/L (ref 135–146)
Total Bilirubin: 0.4 mg/dL (ref 0.2–1.2)
Total Protein: 6.8 g/dL (ref 6.1–8.1)

## 2022-09-23 LAB — THYROID PANEL WITH TSH
Free Thyroxine Index: 3.1 (ref 1.4–3.8)
T3 Uptake: 33 % (ref 22–35)
T4, Total: 9.4 ug/dL (ref 5.1–11.9)
TSH: 0.63 mIU/L

## 2022-09-23 LAB — LIPID PANEL
Cholesterol: 223 mg/dL — ABNORMAL HIGH (ref <200)
HDL: 56 mg/dL (ref >=50)
LDL Cholesterol (Calc): 138 mg/dL (calc) — ABNORMAL HIGH
Non-HDL Cholesterol (Calc): 167 mg/dL (calc) — ABNORMAL HIGH (ref <130)
Total CHOL/HDL Ratio: 4 (calc) (ref <5.0)
Triglycerides: 159 mg/dL — ABNORMAL HIGH (ref <150)

## 2022-09-23 LAB — VITAMIN B12: Vitamin B-12: 519 pg/mL (ref 200–1100)

## 2022-09-23 LAB — VITAMIN D 25 HYDROXY (VIT D DEFICIENCY, FRACTURES): Vit D, 25-Hydroxy: 72 ng/mL (ref 30–100)

## 2022-09-23 LAB — ESTRADIOL: Estradiol: 30 pg/mL

## 2022-09-23 LAB — FOLLICLE STIMULATING HORMONE: FSH: 69.3 m[IU]/mL

## 2022-10-26 ENCOUNTER — Ambulatory Visit
Admission: RE | Admit: 2022-10-26 | Discharge: 2022-10-26 | Disposition: A | Discharge status: Home / Self Care | Payer: BC Managed Care – PPO | Source: Ambulatory Visit | Attending: Family Medicine | Admitting: Family Medicine

## 2022-10-26 DIAGNOSIS — Z1231 Encounter for screening mammogram for malignant neoplasm of breast: Secondary | ICD-10-CM
